# Patient Record
Sex: Female | Born: 1980 | Race: White | Hispanic: No | Marital: Married | Smoking: Current every day smoker
Health system: Southern US, Community
[De-identification: ages and names within clinical notes are randomized; demographics above are authoritative.]

## PROBLEM LIST (undated history)

## (undated) DIAGNOSIS — K219 Gastro-esophageal reflux disease without esophagitis: Secondary | ICD-10-CM

## (undated) DIAGNOSIS — J45909 Unspecified asthma, uncomplicated: Secondary | ICD-10-CM

## (undated) DIAGNOSIS — E785 Hyperlipidemia, unspecified: Secondary | ICD-10-CM

## (undated) DIAGNOSIS — R51 Headache: Secondary | ICD-10-CM

## (undated) DIAGNOSIS — F1410 Cocaine abuse, uncomplicated: Secondary | ICD-10-CM

## (undated) DIAGNOSIS — F32A Depression, unspecified: Secondary | ICD-10-CM

## (undated) DIAGNOSIS — S92909A Unspecified fracture of unspecified foot, initial encounter for closed fracture: Secondary | ICD-10-CM

## (undated) DIAGNOSIS — R519 Headache, unspecified: Secondary | ICD-10-CM

## (undated) DIAGNOSIS — F329 Major depressive disorder, single episode, unspecified: Secondary | ICD-10-CM

## (undated) DIAGNOSIS — I10 Essential (primary) hypertension: Secondary | ICD-10-CM

## (undated) HISTORY — PX: TUBAL LIGATION: SHX77

## (undated) HISTORY — PX: WISDOM TOOTH EXTRACTION: SHX21

## (undated) HISTORY — PX: LAPAROSCOPIC TUBAL LIGATION: SUR803

## (undated) HISTORY — DX: Hyperlipidemia, unspecified: E78.5

## (undated) HISTORY — PX: MULTIPLE TOOTH EXTRACTIONS: SHX2053

---
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
## 2006-02-17 ENCOUNTER — Ambulatory Visit: Payer: Self-pay | Admitting: *Deleted

## 2006-02-17 ENCOUNTER — Inpatient Hospital Stay (HOSPITAL_COMMUNITY): Admission: AD | Admit: 2006-02-17 | Discharge: 2006-02-20 | Payer: Self-pay | Admitting: *Deleted

## 2010-04-12 ENCOUNTER — Ambulatory Visit: Payer: Self-pay | Admitting: Psychiatry

## 2010-04-12 ENCOUNTER — Emergency Department (HOSPITAL_COMMUNITY): Admission: EM | Admit: 2010-04-12 | Discharge: 2010-04-12 | Payer: Self-pay | Admitting: Emergency Medicine

## 2010-04-13 ENCOUNTER — Inpatient Hospital Stay (HOSPITAL_COMMUNITY)
Admission: EM | Admit: 2010-04-13 | Discharge: 2010-04-16 | Payer: Self-pay | Source: Home / Self Care | Admitting: Psychiatry

## 2010-08-14 LAB — SALICYLATE LEVEL: Salicylate Lvl: <4 mg/dL (ref 2.8–20.0)

## 2010-08-14 LAB — CBC
Hemoglobin: 13.3 g/dL (ref 12.0–15.0)
MCH: 29.6 pg (ref 26.0–34.0)
MCHC: 33.7 g/dL (ref 30.0–36.0)
Platelets: 194 10*3/uL (ref 150–400)
RBC: 4.49 MIL/uL (ref 3.87–5.11)

## 2010-08-14 LAB — URINALYSIS, ROUTINE W REFLEX MICROSCOPIC
Ketones, ur: NEGATIVE mg/dL
Protein, ur: NEGATIVE mg/dL
Urobilinogen, UA: 0.2 mg/dL (ref 0.0–1.0)

## 2010-08-14 LAB — ACETAMINOPHEN LEVEL: Acetaminophen (Tylenol), Serum: <10 ug/mL — ABNORMAL LOW (ref 10–30)

## 2010-08-14 LAB — BASIC METABOLIC PANEL
Calcium: 8.7 mg/dL (ref 8.4–10.5)
Creatinine, Ser: 0.78 mg/dL (ref 0.4–1.2)
GFR calc Af Amer: >60 mL/min (ref >60)
GFR calc non Af Amer: >60 mL/min (ref >60)
Glucose, Bld: 66 mg/dL — ABNORMAL LOW (ref 70–99)
Sodium: 141 mEq/L (ref 135–145)

## 2010-08-14 LAB — RAPID URINE DRUG SCREEN, HOSP PERFORMED
Amphetamines: NOT DETECTED
Benzodiazepines: NOT DETECTED
Tetrahydrocannabinol: NOT DETECTED

## 2010-08-14 LAB — URINE CULTURE: Colony Count: 85000

## 2010-08-14 LAB — DIFFERENTIAL
Basophils Relative: 1 % (ref 0–1)
Eosinophils Absolute: 0.1 10*3/uL (ref 0.0–0.7)
Monocytes Relative: 5 % (ref 3–12)
Neutro Abs: 6.3 10*3/uL (ref 1.7–7.7)
Neutrophils Relative %: 61 % (ref 43–77)

## 2010-08-14 LAB — URINE MICROSCOPIC-ADD ON

## 2010-08-14 LAB — PREGNANCY, URINE: Preg Test, Ur: NEGATIVE

## 2010-10-19 NOTE — H&P (Signed)
NAME:  Pamela Sanford, Pamela Sanford NO.:  [id number]   MEDICAL RECORD NO.:  [id number]          PATIENT TYPE:  IPS   LOCATION:  0303                          FACILITY:  [facility]   PHYSICIAN:  Jasmine Pang, M.D. DATE OF BIRTH:  07/23/1980   DATE OF ADMISSION:  02/17/2006  DATE OF DISCHARGE:                         PSYCHIATRIC ADMISSION ASSESSMENT   IDENTIFYING INFORMATION:  This is a 30 year old single white female, this is  a voluntary admission.   HISTORY OF PRESENT ILLNESS:  This 77 year old single mother left her baby in  the care of an adult relative last Thursday as planned, then went on an  unexpected four day crack binge.  She called the caregiver on Friday to  check on the condition of the baby.  Then, when the caregiver attempted to  return the baby on Saturday, the patient could not be found.  The caregiver  filed a missing persons report and protective services was activated.  The  patient presented to the emergency room with suicidal thoughts, admitting to  having depression for the past two weeks, suicidal thoughts for two days,  with the plan to overdose on her medication.  She endorses two weeks of  cocaine cravings, increasingly depressed mood that is somewhat variable  stating I cannot believe that I did that referring to abandoning her child  for a crack binge.  She endorses being stressed by the recent death of her  mother six months ago who was found dead in her home, possibly from cardiac  problems, and the patient endorses prostituting herself for drugs and cash,  most recently within the past week.  She reports that her relationship with  her boyfriend is stable.  Her mother was her best friend and primary  caregiver and then was gone.  The patient also endorses having significant  cocaine cravings which come on and off with variable mood and some thought  agitation.   PAST PSYCHIATRIC HISTORY:  This is the first admission to [facility].  The patient does have a history of a prior  admission to [facility] at age 67 but is unable to describe  any very clear circumstances regarding the admission.  She also does not  know what medication she was given, although was treated with medications at  that time.  She is a poor historian.  She does endorse the use of substances  including cocaine and recently was prescribed Xanax which she is taking as  prescribed.   ALCOHOL AND DRUG HISTORY:  As noted above, the patient endorses a history of  cocaine use, denies drug or other substance use.   MEDICAL HISTORY:  The patient is followed by Dr. Sherryll Burger who [place] and Dr. Catha Gosselin who is her OB/GYN  physician.  Medication problems are none.   MEDICATIONS:  Omeprazole 20 mg daily.   She is using condoms for contraception.   ALLERGIES:  No drug allergies.   POSITIVE PHYSICAL FINDINGS:  Full physical exam was done in the emergency  room, it is noted in the record.  On admission to our unit, this is a small  built female, 5 feet 2 inches tall, 142 pounds, temperature 98.4, pulse 79,  respirations 22, blood pressure 111/68.  Neurologic exam was normal.  She  does have a disconjugate gaze.   DIAGNOSTIC STUDIES:  CBC with WBC 12.7, hemoglobin 14, hematocrit 40.7,  platelets 210,000.  Chemistries reveal sodium 139, potassium 3.5, chloride  106, CO2 26, BUN 6, creatinine 1.1, and random glucose 94, calcium 9.1,  alcohol level was less than 5.  Urine pregnancy test was negative.  Urine  drug screen was positive for cocaine, negative for benzodiazepine.  She does  have a prescription for Xanax 0.25 mg b.i.d. p.r.n. but has not had any in  about four days.   MENTAL STATUS EXAM:  Full alert female with blunted affect, variable eye  contact, generally cooperative and directable, socially withdrawn, affect  blunting is present.  Speech decreased in production, soft in  tone, barely  audible at times, normal pace.  Mood depressed, ashamed, guilty, verbalizes  her shame over using cocaine and neglecting the care of her infant.  She is  very interested in doing whatever she needs to do to assume her parental  duties.  She wants to comply with social services request.  Thought process  logical and coherent.  Positive for suicidal thoughts with thoughts of  possibly overdosing on medicine, no evidence of psychosis, no homicidal  thoughts, she is able to contract for safety.  She is oriented x3.  Cognition is intact.  Insight is poor.  Impulse control and judgment within  normal limits.   DIAGNOSIS:  AXIS I            Major depression versus bipolar disorder,  cocaine abuse and dependence.  AXIS II           Deferred.  AXIS III          Dental caries, teeth being in very poor condition.  AXIS IV           Severe issues with parenting and grief issues related to  the death of a parent and loss of primary family support.  AXIS V            Current 34, past year 24.   PLAN:  Voluntarily admit the patient with q.15 minute checks in place, to  alleviate her suicidal thought, control her cocaine cravings, and improve  her coping skills.  We are going to ask the case manager to, with the  patient's permission, to coordinate with [facility], and the patient is interested  in this.  We are starting her on Symmetrel 100 mg p.o. b.i.d. for cocaine  cravings which she is interested in.  We have discussed with her the  possibility of getting into a dental clinic and we are going to try to  coordinate that for her through the health department and we are going to  start her on Depakote ER 250 mg p.o. q.h.s. and Zoloft 25 mg daily.  The  patient is able to verbalize her agreement with the plan.  Estimated length  of stay is six days.      Margaret A. Lorin Picket, N.P.      Jasmine Pang, M.D.  Electronically Signed   MAS/MEDQ  D:  02/18/2006  T:  02/19/2006  Job:   [id number]

## 2010-10-19 NOTE — Discharge Summary (Signed)
NAME:  Pamela Sanford, FIALLOS NO.:  [id number]   MEDICAL RECORD NO.:  [id number]          PATIENT TYPE:  IPS   LOCATION:  0303                          FACILITY:  [facility]   PHYSICIAN:  Jasmine Pang, M.D. DATE OF BIRTH:  11/05/1980   DATE OF ADMISSION:  02/18/2006  DATE OF DISCHARGE:  02/20/2006                                 DISCHARGE SUMMARY   IDENTIFYING INFORMATION:  Patient is a 30 year old single Caucasian female  who was admitted on a voluntary basis.   HISTORY OF PRESENT ILLNESS:  This patient has severe cocaine problems.  She  had left her baby in the care of an adult relative then went on a 4-day  crack binge.  She could not be found.  Her boyfriend, who does not use  drugs, looked for her everywhere and finally did locate her.  Missing  Persons report was placed with the police.  Protective services are involved  and are requiring the patient to get treatment.  She cannot get her baby  back immediately.  The patient reports suicidal ideation for 2 weeks with a  plan to overdose, some agitation, periodic cocaine cravings, she endorses  depressed mood and variable mood.  She is stressed by the death of her  mother 6 months ago who was found dead of (?) cardiac problems in the home.  The patient has been prostituting herself for drugs and cash.  This is the  first [facility] admission for the patient.  She had a history of admission to  [facility] at the age of 59.  She is on no psychiatric  medications.  For further admission information see psychiatric admission  assessment.   PHYSICAL EXAMINATION:  Physical exam was done in the [facility] ED.  She was  healthy with no acute medical problems.   ADMISSION LABORATORIES:  Admission laboratories were done in the ED and  reviewed by the physician there.   HOSPITAL COURSE:  Upon admission the patient was continued on omeprazole 20  mg p.o. daily.  She was also placed on trazodone 50 mg p.o. q.h.s.  p.r.n.  insomnia and Librium 25 mg p.o. q.6 h p.r.n. withdrawal or anxiety.  She was  placed on Seroquel 25 mg p.o. q.4 h p.r.n. agitation.  On February 18, 2006  the patient was started on Zoloft 25 mg daily and Depakote ER 250 mg q.h.s.,  the Librium was discontinued, she was also started on Symmetrel 100 mg p.o.  b.i.d.  On the day of discharge the patient's Depakote ER was increased to  500 mg p.o. q.h.s. and Zoloft was increased to 50 mg p.o. daily.  The  patient tolerated these medications well with no significant side effects.   On February 18, 2006 the patient stated she is here for detox from cocaine.  She has a history of mood swings, some depression versus some involvement in  activities that are harmful (prostitution and cocaine use, leaving home for  4 days without letting anyone know, she has a 101-month-old son at home, her  mother died suddenly of an MI 6 months ago).  She feels very regretful for  having left her son like she did.  On February 19, 2006 the patient was  quiet and reserved but cooperative.  She discussed her crack addiction.  She  states the amantadine prescribed in the hospital helped.  She feels less  depressed then when she came in.  Upon admission she stated, I couldn't do  nothing right.  She states her 57-month-old son is with his foster  grandmother.  They have already set up a long-term treatment program at  [facility] for her upon discharge.   On February 20, 2006 the patient's mental status had improved markedly.  She was friendly, warm and cooperative.  She had good eye contact.  Psychomotor activity was within normal limits.  Her speech was normal rate  and flow.  Mood was less depressed and anxious.  Affect wide range.  No  suicidal or homicidal ideation.  No self-injurious behavior.  No auditory or  visual hallucinations.  No paranoia or delusions.  Thoughts logical and goal-  directed.  Thought content no  predominant theme.  Cognitive was back to  baseline.  She was felt ready to go home.   DISCHARGE DIAGNOSES:   AXIS I:  1. Mood disorder, not otherwise specified (rule out bipolar disorder).  2. Cocaine dependence.   AXIS II:  None.   AXIS III:  Dental caries.   AXIS IV:  Severe (problems with primary support group, grief over the death  of her parent, other psychosocial problems including prostituting herself  for drugs and money, economic problems).   AXIS V:  Global Assessment of Functioning upon discharge was 50.  Global  Assessment of Functioning upon admission was 34.  Global Assessment of  Functioning highest past year 67.   DISCHARGE PLANS:  There were no activity level or dietary restrictions.  She  was to follow up at the dental clinic in [facility]  Department.  To make an appointment she is to call and was given a number to  arrange this appointment.  She was to follow up at the health department or  her OB doctor for well-woman care and birth control issues.   DISCHARGE MEDICATIONS:  1. Omeprazole 20 mg daily.  2. Amantadine 100 mg b.i.d.  3. Zoloft 50 mg daily.  4. Depakote ER 500 mg at h.s.  5. Trazodone 50 mg at h.s. p.r.n. insomnia.   PENDING LABORATORIES:  An a.m. Depakote level, hepatic profile and CBC are  pending at the time of discharge but are not returned yet.  STD workup was  also pending at the time of discharge.  This will be followed up on.   POST-HOSPITAL CARE PLANS:  The patient will be seen at the [facility] on February 27, 2006 at 10:30 a.m.      Jasmine Pang, M.D.  Electronically Signed     BHS/MEDQ  D:  02/20/2006  T:  02/21/2006  Job:  [id number]

## 2013-02-24 ENCOUNTER — Emergency Department (HOSPITAL_COMMUNITY)
Admission: EM | Admit: 2013-02-24 | Discharge: 2013-02-24 | Disposition: A | Discharge status: Home / Self Care | Payer: Self-pay | Attending: Emergency Medicine | Admitting: Emergency Medicine

## 2013-02-24 ENCOUNTER — Encounter (HOSPITAL_COMMUNITY): Payer: Self-pay | Admitting: Emergency Medicine

## 2013-02-24 DIAGNOSIS — F17200 Nicotine dependence, unspecified, uncomplicated: Secondary | ICD-10-CM | POA: Insufficient documentation

## 2013-02-24 DIAGNOSIS — M25473 Effusion, unspecified ankle: Secondary | ICD-10-CM | POA: Insufficient documentation

## 2013-02-24 DIAGNOSIS — Z8659 Personal history of other mental and behavioral disorders: Secondary | ICD-10-CM | POA: Insufficient documentation

## 2013-02-24 DIAGNOSIS — R52 Pain, unspecified: Secondary | ICD-10-CM | POA: Insufficient documentation

## 2013-02-24 DIAGNOSIS — S92902S Unspecified fracture of left foot, sequela: Secondary | ICD-10-CM

## 2013-02-24 DIAGNOSIS — M25579 Pain in unspecified ankle and joints of unspecified foot: Secondary | ICD-10-CM | POA: Insufficient documentation

## 2013-02-24 DIAGNOSIS — M25476 Effusion, unspecified foot: Secondary | ICD-10-CM | POA: Insufficient documentation

## 2013-02-24 HISTORY — DX: Depression, unspecified: F32.A

## 2013-02-24 HISTORY — DX: Major depressive disorder, single episode, unspecified: F32.9

## 2013-02-24 HISTORY — DX: Unspecified fracture of unspecified foot, initial encounter for closed fracture: S92.909A

## 2013-02-24 MED ORDER — OXYCODONE-ACETAMINOPHEN 5-325 MG PO TABS: 1.0000 | ORAL_TABLET | ORAL | Status: DC | PRN

## 2013-02-24 NOTE — ED Notes (Signed)
Patient here for recheck of fracture to foot. Per patient happened last week and was seen here but unable to follow-up with orthopedic dr yet. Patient has splint to right foot and leg. Patient reports increased pain, especially at night.

## 2013-02-27 NOTE — ED Provider Notes (Signed)
CSN: [id number]     Arrival date & time 02/24/13  1400 History   First MD Initiated Contact with Patient 02/24/13 1434     Chief Complaint  Patient presents with  . Foot Injury   (Consider location/radiation/quality/duration/timing/severity/associated sxs/prior Treatment) HPI Comments: Pamela Sanford is a 32 y.o. Female presenting for assistance with pain control as she is waiting to get in with an orthopedic doctor in [place] [facility]) for management of a foot fracture she sustained 5 days ago.  She was seen at [facility] at which time she was placed in a posterior splint and has been using crutches to avoid weight bearing.  She has run out of her oxycodone and now has increased pain.  She is scheduled to see the orthopedist in 4 days.  She denies any new pain, just increased pain since she ran out of her pain pills.  She is using ice and elevation as much as possible.  She denies numbness distal in her foot or toes and her swelling is improving.    The history is provided by the patient.    Past Medical History  Diagnosis Date  . Foot fracture   . Depression    Past Surgical History  Procedure Laterality Date  . Wisdom tooth extraction    . Multiple tooth extractions     History reviewed. No pertinent family history. History  Substance Use Topics  . Smoking status: Current Every Day Smoker -- 0.04 packs/day for 8 years    Types: Cigarettes  . Smokeless tobacco: Never Used  . Alcohol Use: No   OB History   Grav Para Term Preterm Abortions TAB SAB Ect Mult Living   2 2 2       2      Review of Systems  Constitutional: Negative for fever.  Musculoskeletal: Positive for joint swelling and arthralgias. Negative for myalgias.  Neurological: Negative for weakness and numbness.    Allergies  Review of patient's allergies indicates no known allergies.  Home Medications   Current Outpatient Rx  Name  Route  Sig  Dispense  Refill  . ibuprofen (ADVIL,MOTRIN) 800 MG  tablet   Oral   Take 800 mg by mouth every 8 (eight) hours as needed for pain.         Marland Kitchen oxyCODONE-acetaminophen (PERCOCET/ROXICET) 5-325 MG per tablet   Oral   Take 1 tablet by mouth every 4 (four) hours as needed for pain.   20 tablet   0    BP 109/60  Pulse 94  Temp(Src) 97.9 F (36.6 C) (Oral)  Resp 17  Ht 5\' 4"  (1.626 m)  Wt 150 lb (68.04 kg)  BMI 25.73 kg/m2  SpO2 100%  LMP 02/02/2013 Physical Exam  Constitutional: She appears well-developed and well-nourished.  HENT:  Head: Atraumatic.  Neck: Normal range of motion.  Cardiovascular:  Pulses equal bilaterally  Musculoskeletal: She exhibits tenderness.  ttp dorsal midfoot.  Dorsal pedis pulse intact. Less than 3 sec cap refill in toes.  Neurological: She is alert. She has normal strength. She displays normal reflexes. No sensory deficit.  Equal strength  Skin: Skin is warm and dry.  Psychiatric: She has a normal mood and affect.    ED Course  Procedures (including critical care time) Labs Review Labs Reviewed - No data to display Imaging Review No results found.  MDM   1. Foot fracture, left, sequela    Xray from [facility] hospital reviewed,  Avulsion fx at proximal navicular bone.  [place]  controlled substance database reviewed. Pt does not have hx of frequent narcotic prescriptions.  She was prescribed oxycodone,  Encouraged f/u with ortho as planned.    Burgess Amor, PA-C 02/27/13 1158

## 2013-03-01 NOTE — ED Provider Notes (Signed)
Medical screening examination/treatment/procedure(s) were performed by non-physician practitioner and as supervising physician I was immediately available for consultation/collaboration.  Allicia Culley, MD 03/01/13 1648 

## 2013-04-01 ENCOUNTER — Encounter (HOSPITAL_COMMUNITY): Payer: Self-pay | Admitting: Emergency Medicine

## 2013-04-01 ENCOUNTER — Emergency Department (HOSPITAL_COMMUNITY): Payer: Medicaid Other

## 2013-04-01 ENCOUNTER — Emergency Department (HOSPITAL_COMMUNITY)
Admission: EM | Admit: 2013-04-01 | Discharge: 2013-04-01 | Disposition: A | Discharge status: Home / Self Care | Payer: Medicaid Other | Attending: Emergency Medicine | Admitting: Emergency Medicine

## 2013-04-01 DIAGNOSIS — Z8659 Personal history of other mental and behavioral disorders: Secondary | ICD-10-CM | POA: Insufficient documentation

## 2013-04-01 DIAGNOSIS — S9031XA Contusion of right foot, initial encounter: Secondary | ICD-10-CM

## 2013-04-01 DIAGNOSIS — S9030XA Contusion of unspecified foot, initial encounter: Secondary | ICD-10-CM | POA: Insufficient documentation

## 2013-04-01 DIAGNOSIS — Z8719 Personal history of other diseases of the digestive system: Secondary | ICD-10-CM | POA: Insufficient documentation

## 2013-04-01 DIAGNOSIS — IMO0002 Reserved for concepts with insufficient information to code with codable children: Secondary | ICD-10-CM | POA: Insufficient documentation

## 2013-04-01 DIAGNOSIS — Y9389 Activity, other specified: Secondary | ICD-10-CM | POA: Insufficient documentation

## 2013-04-01 DIAGNOSIS — F17200 Nicotine dependence, unspecified, uncomplicated: Secondary | ICD-10-CM | POA: Insufficient documentation

## 2013-04-01 DIAGNOSIS — Y929 Unspecified place or not applicable: Secondary | ICD-10-CM | POA: Insufficient documentation

## 2013-04-01 DIAGNOSIS — IMO0001 Reserved for inherently not codable concepts without codable children: Secondary | ICD-10-CM | POA: Insufficient documentation

## 2013-04-01 HISTORY — DX: Gastro-esophageal reflux disease without esophagitis: K21.9

## 2013-04-01 MED ORDER — NAPROXEN 500 MG PO TABS: 500.0000 mg | ORAL_TABLET | Freq: Two times a day (BID) | ORAL | Status: DC

## 2013-04-01 MED ORDER — HYDROCODONE-ACETAMINOPHEN 5-325 MG PO TABS
1.0000 | ORAL_TABLET | Freq: Once | ORAL | Status: AC
  Administered 2013-04-01: 1 via ORAL
  Filled 2013-04-01: qty 1
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
## 2013-04-01 NOTE — ED Notes (Signed)
Patient with no complaints at this time. Respirations even and unlabored. Skin warm/dry. Discharge instructions reviewed with patient at this time. Patient given opportunity to voice concerns/ask questions. IV removed per policy and band-aid applied to site. Patient discharged at this time and left Emergency Department with steady gait. Patient has ride home

## 2013-04-01 NOTE — ED Provider Notes (Signed)
Medical screening examination/treatment/procedure(s) were performed by non-physician practitioner and as supervising physician I was immediately available for consultation/collaboration.    Vida Roller, MD 04/01/13 1600
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
## 2013-04-24 ENCOUNTER — Encounter (HOSPITAL_COMMUNITY): Payer: Self-pay | Admitting: Emergency Medicine

## 2013-04-24 ENCOUNTER — Emergency Department (HOSPITAL_COMMUNITY): Payer: Medicaid Other

## 2013-04-24 ENCOUNTER — Emergency Department (HOSPITAL_COMMUNITY)
Admission: EM | Admit: 2013-04-24 | Discharge: 2013-04-24 | Disposition: A | Discharge status: Home / Self Care | Payer: Medicaid Other | Attending: Emergency Medicine | Admitting: Emergency Medicine

## 2013-04-24 DIAGNOSIS — R300 Dysuria: Secondary | ICD-10-CM | POA: Insufficient documentation

## 2013-04-24 DIAGNOSIS — Z8659 Personal history of other mental and behavioral disorders: Secondary | ICD-10-CM | POA: Insufficient documentation

## 2013-04-24 DIAGNOSIS — R350 Frequency of micturition: Secondary | ICD-10-CM | POA: Insufficient documentation

## 2013-04-24 DIAGNOSIS — R109 Unspecified abdominal pain: Secondary | ICD-10-CM

## 2013-04-24 DIAGNOSIS — F17200 Nicotine dependence, unspecified, uncomplicated: Secondary | ICD-10-CM | POA: Insufficient documentation

## 2013-04-24 DIAGNOSIS — Z8719 Personal history of other diseases of the digestive system: Secondary | ICD-10-CM | POA: Insufficient documentation

## 2013-04-24 DIAGNOSIS — R197 Diarrhea, unspecified: Secondary | ICD-10-CM | POA: Insufficient documentation

## 2013-04-24 DIAGNOSIS — J45909 Unspecified asthma, uncomplicated: Secondary | ICD-10-CM | POA: Insufficient documentation

## 2013-04-24 DIAGNOSIS — Z3202 Encounter for pregnancy test, result negative: Secondary | ICD-10-CM | POA: Insufficient documentation

## 2013-04-24 DIAGNOSIS — Z8781 Personal history of (healed) traumatic fracture: Secondary | ICD-10-CM | POA: Insufficient documentation

## 2013-04-24 HISTORY — DX: Unspecified asthma, uncomplicated: J45.909

## 2013-04-24 LAB — CBC WITH DIFFERENTIAL/PLATELET
Basophils Absolute: 0.1 10*3/uL (ref 0.0–0.1)
Eosinophils Absolute: 0.2 10*3/uL (ref 0.0–0.7)
Eosinophils Relative: 2 % (ref 0–5)
HCT: 38.8 % (ref 36.0–46.0)
Lymphocytes Relative: 38 % (ref 12–46)
Lymphs Abs: 4 10*3/uL (ref 0.7–4.0)
MCH: 29.2 pg (ref 26.0–34.0)
MCV: 86.6 fL (ref 78.0–100.0)
Monocytes Absolute: 0.5 10*3/uL (ref 0.1–1.0)
RDW: 13.2 % (ref 11.5–15.5)
WBC: 10.4 10*3/uL (ref 4.0–10.5)

## 2013-04-24 LAB — COMPREHENSIVE METABOLIC PANEL
ALT: 9 U/L (ref 0–35)
AST: 11 U/L (ref 0–37)
Albumin: 4 g/dL (ref 3.5–5.2)
CO2: 25 mEq/L (ref 19–32)
Chloride: 102 mEq/L (ref 96–112)
Creatinine, Ser: 0.85 mg/dL (ref 0.50–1.10)
GFR calc non Af Amer: >90 mL/min (ref >90)
Potassium: 3.8 mEq/L (ref 3.5–5.1)
Sodium: 137 mEq/L (ref 135–145)
Total Bilirubin: 0.2 mg/dL — ABNORMAL LOW (ref 0.3–1.2)

## 2013-04-24 LAB — URINALYSIS, ROUTINE W REFLEX MICROSCOPIC
Glucose, UA: NEGATIVE mg/dL
Hgb urine dipstick: NEGATIVE
Leukocytes, UA: NEGATIVE
Specific Gravity, Urine: >1.03 — ABNORMAL HIGH (ref 1.005–1.030)
pH: 6 (ref 5.0–8.0)

## 2013-04-24 LAB — PREGNANCY, URINE: Preg Test, Ur: NEGATIVE

## 2013-04-24 MED ORDER — HYDROCODONE-ACETAMINOPHEN 5-325 MG PO TABS: 1.0000 | ORAL_TABLET | Freq: Four times a day (QID) | ORAL | Status: DC | PRN

## 2013-04-24 MED ORDER — HYDROMORPHONE HCL PF 1 MG/ML IJ SOLN
1.0000 mg | Freq: Once | INTRAMUSCULAR | Status: AC
  Administered 2013-04-24: 1 mg via INTRAVENOUS
  Filled 2013-04-24: qty 1

## 2013-04-24 MED ORDER — SODIUM CHLORIDE 0.9 % IV SOLN: INTRAVENOUS | Status: DC

## 2013-04-24 MED ORDER — ONDANSETRON HCL 4 MG/2ML IJ SOLN
4.0000 mg | Freq: Once | INTRAMUSCULAR | Status: AC
  Administered 2013-04-24: 4 mg via INTRAVENOUS
  Filled 2013-04-24: qty 2

## 2013-04-24 MED ORDER — IOHEXOL 300 MG/ML  SOLN
50.0000 mL | Freq: Once | INTRAMUSCULAR | Status: AC | PRN
  Administered 2013-04-24: 50 mL via ORAL

## 2013-04-24 MED ORDER — SODIUM CHLORIDE 0.9 % IV BOLUS (SEPSIS)
250.0000 mL | Freq: Once | INTRAVENOUS | Status: AC
  Administered 2013-04-24: 250 mL via INTRAVENOUS

## 2013-04-24 MED ORDER — IOHEXOL 300 MG/ML  SOLN
100.0000 mL | Freq: Once | INTRAMUSCULAR | Status: AC | PRN
  Administered 2013-04-24: 100 mL via INTRAVENOUS

## 2013-04-24 NOTE — ED Provider Notes (Signed)
CSN: [id number]     Arrival date & time 04/24/13  1723 History   First MD Initiated Contact with Patient 04/24/13 1739     Chief Complaint  Patient presents with  . Abdominal Pain  . Dysuria   (Consider location/radiation/quality/duration/timing/severity/associated sxs/prior Treatment) Patient is a 32 y.o. female presenting with abdominal pain and dysuria. The history is provided by the patient.  Abdominal Pain Associated symptoms: diarrhea and dysuria   Associated symptoms: no chest pain, no fever, no nausea, no shortness of breath and no vomiting   Dysuria Associated symptoms: abdominal pain   Associated symptoms: no fever, no nausea and no vomiting    patient with one-week history of lower bowel pain occasional abdominal pain. Associated with urinary frequency and burning some loose bowel movements no true diarrhea. No nausea no vomiting no fever. Patient's had similar symptoms in the past as been related to urinary tract infection. Pain is described as an 8 constant but waxes and wanes. Denies any blood in the urine. No blood in the bowel movements. Pain is nonradiating. Not made better or worse by anything. Patient gives pain an 8/10.  Past Medical History  Diagnosis Date  . Foot fracture   . Depression   . GERD (gastroesophageal reflux disease)   . Asthma    Past Surgical History  Procedure Laterality Date  . Wisdom tooth extraction    . Multiple tooth extractions     History reviewed. No pertinent family history. History  Substance Use Topics  . Smoking status: Current Every Day Smoker -- 0.04 packs/day for 8 years    Types: Cigarettes  . Smokeless tobacco: Never Used  . Alcohol Use: No   OB History   Grav Para Term Preterm Abortions TAB SAB Ect Mult Living   2 2 2       2      Review of Systems  Constitutional: Negative for fever.  HENT: Negative for congestion.   Respiratory: Negative for shortness of breath.   Cardiovascular: Negative for chest pain.   Gastrointestinal: Positive for abdominal pain and diarrhea. Negative for nausea and vomiting.  Genitourinary: Positive for dysuria and frequency.  Musculoskeletal: Negative for back pain.  Skin: Negative for rash.  Neurological: Negative for headaches.  Hematological: Does not bruise/bleed easily.  Psychiatric/Behavioral: Negative for confusion.    Allergies  Ibuprofen and Tramadol  Home Medications   Current Outpatient Rx  Name  Route  Sig  Dispense  Refill  . HYDROcodone-acetaminophen (NORCO/VICODIN) 5-325 MG per tablet   Oral   Take 1-2 tablets by mouth every 6 (six) hours as needed for moderate pain.   14 tablet   0    BP 117/70  Pulse 86  Temp(Src) 97.5 F (36.4 C) (Oral)  Resp 18  Ht 5\' 3"  (1.6 m)  Wt 150 lb (68.04 kg)  BMI 26.58 kg/m2  SpO2 100%  LMP 04/14/2013 Physical Exam  Nursing note and vitals reviewed. Constitutional: She is oriented to person, place, and time. She appears well-developed and well-nourished. No distress.  HENT:  Head: Normocephalic and atraumatic.  Mouth/Throat: Oropharynx is clear and moist.  Eyes: Conjunctivae and EOM are normal. Pupils are equal, round, and reactive to light.  Neck: Normal range of motion.  Cardiovascular: Normal rate, regular rhythm and normal heart sounds.   No murmur heard. Pulmonary/Chest: Effort normal and breath sounds normal. No respiratory distress.  Abdominal: Soft. Bowel sounds are normal. She exhibits no mass. There is no tenderness.  Musculoskeletal: Normal range of  motion.  Neurological: She is alert and oriented to person, place, and time. No cranial nerve deficit. She exhibits normal muscle tone. Coordination normal.  Skin: Skin is warm. No rash noted.    ED Course  Procedures (including critical care time) Labs Review Labs Reviewed  URINALYSIS, ROUTINE W REFLEX MICROSCOPIC - Abnormal; Notable for the following:    APPearance HAZY (*)    Specific Gravity, Urine >1.030 (*)    All other  components within normal limits  COMPREHENSIVE METABOLIC PANEL - Abnormal; Notable for the following:    Total Bilirubin 0.2 (*)    All other components within normal limits  LIPASE, BLOOD - Abnormal; Notable for the following:    Lipase 81 (*)    All other components within normal limits  PREGNANCY, URINE  CBC WITH DIFFERENTIAL   Results for orders placed during the hospital encounter of 04/24/13  URINALYSIS, ROUTINE W REFLEX MICROSCOPIC      Result Value Range   Color, Urine YELLOW  YELLOW   APPearance HAZY (*) CLEAR   Specific Gravity, Urine >1.030 (*) 1.005 - 1.030   pH 6.0  5.0 - 8.0   Glucose, UA NEGATIVE  NEGATIVE mg/dL   Hgb urine dipstick NEGATIVE  NEGATIVE   Bilirubin Urine NEGATIVE  NEGATIVE   Ketones, ur NEGATIVE  NEGATIVE mg/dL   Protein, ur NEGATIVE  NEGATIVE mg/dL   Urobilinogen, UA 0.2  0.0 - 1.0 mg/dL   Nitrite NEGATIVE  NEGATIVE   Leukocytes, UA NEGATIVE  NEGATIVE  PREGNANCY, URINE      Result Value Range   Preg Test, Ur NEGATIVE  NEGATIVE  COMPREHENSIVE METABOLIC PANEL      Result Value Range   Sodium 137  135 - 145 mEq/L   Potassium 3.8  3.5 - 5.1 mEq/L   Chloride 102  96 - 112 mEq/L   CO2 25  19 - 32 mEq/L   Glucose, Bld 80  70 - 99 mg/dL   BUN 16  6 - 23 mg/dL   Creatinine, Ser 1.61  0.50 - 1.10 mg/dL   Calcium 9.3  8.4 - 09.6 mg/dL   Total Protein 7.1  6.0 - 8.3 g/dL   Albumin 4.0  3.5 - 5.2 g/dL   AST 11  0 - 37 U/L   ALT 9  0 - 35 U/L   Alkaline Phosphatase 70  39 - 117 U/L   Total Bilirubin 0.2 (*) 0.3 - 1.2 mg/dL   GFR calc non Af Amer >90  >90 mL/min   GFR calc Af Amer >90  >90 mL/min  LIPASE, BLOOD      Result Value Range   Lipase 81 (*) 11 - 59 U/L  CBC WITH DIFFERENTIAL      Result Value Range   WBC 10.4  4.0 - 10.5 K/uL   RBC 4.48  3.87 - 5.11 MIL/uL   Hemoglobin 13.1  12.0 - 15.0 g/dL   HCT 04.5  40.9 - 81.1 %   MCV 86.6  78.0 - 100.0 fL   MCH 29.2  26.0 - 34.0 pg   MCHC 33.8  30.0 - 36.0 g/dL   RDW 91.4  78.2 - 95.6 %    Platelets 216  150 - 400 K/uL   Neutrophils Relative % 54  43 - 77 %   Neutro Abs 5.6  1.7 - 7.7 K/uL   Lymphocytes Relative 38  12 - 46 %   Lymphs Abs 4.0  0.7 - 4.0 K/uL   Monocytes  Relative 5  3 - 12 %   Monocytes Absolute 0.5  0.1 - 1.0 K/uL   Eosinophils Relative 2  0 - 5 %   Eosinophils Absolute 0.2  0.0 - 0.7 K/uL   Basophils Relative 1  0 - 1 %   Basophils Absolute 0.1  0.0 - 0.1 K/uL    Imaging Review Ct Abdomen Pelvis W Contrast  04/24/2013   CLINICAL DATA:  Mid abdomen pain and dysuria  EXAM: CT ABDOMEN AND PELVIS WITH CONTRAST  TECHNIQUE: Multidetector CT imaging of the abdomen and pelvis was performed using the standard protocol following bolus administration of intravenous contrast.  CONTRAST:  50mL OMNIPAQUE IOHEXOL 300 MG/ML SOLN, OMNIPAQUE IOHEXOL 300 MG/ML SOLN  COMPARISON:  December 02, 2008  FINDINGS: The liver, spleen, pancreas, gallbladder, adrenal glands and kidneys are normal. There is no hydronephrosis bilaterally. Bilateral parapelvic renal cysts are identified. The aorta is normal. There is no abdominal lymphadenopathy. There is no small bowel obstruction or diverticulitis. The appendix is normal.  Images of the pelvis demonstrate fluid-filled bladder without abnormality. The uterus is normal. There is minimal dependent atelectasis of the posterior right lung base. There is no focal pneumonia or pleural effusion in the visualized lung bases. No acute abnormalities identified within the visualized bones.  IMPRESSION: No acute abnormality identified in the abdomen and pelvis.   Electronically Signed   By: Sherian Rein M.D.   On: 04/24/2013 19:40    EKG Interpretation   None       MDM   1. Abdominal pain    Workup for the abdominal pain without significant findings. Clinically it sound as if it was a urinary tract infection or perhaps early pyelonephritis. However CT scan is negative urinalysis is negative labs without significant findings there is a mild  elevation in the lipase symptoms do not clinically seemed to be consistent with pancreatitis. Have recommended patient maybe try bland diet or liquid diet for the next 24 hours. Urinalysis dated was negative. Also pregnancy test negative. No evidence of gallbladder disease no evidence of appendicitis.    Shelda Jakes, MD 04/24/13 2022

## 2013-04-24 NOTE — ED Notes (Addendum)
Pt with upper and lower abd pain since Sunday afternoon, at times with frequency, +burning on urination, + diarrhea, denies N/V

## 2013-06-02 ENCOUNTER — Encounter (HOSPITAL_COMMUNITY): Payer: Self-pay | Admitting: Emergency Medicine

## 2013-06-02 ENCOUNTER — Emergency Department (HOSPITAL_COMMUNITY)
Admission: EM | Admit: 2013-06-02 | Discharge: 2013-06-02 | Disposition: A | Discharge status: Home / Self Care | Payer: Medicaid Other | Attending: Emergency Medicine | Admitting: Emergency Medicine

## 2013-06-02 ENCOUNTER — Emergency Department (HOSPITAL_COMMUNITY): Payer: Medicaid Other

## 2013-06-02 DIAGNOSIS — M545 Low back pain, unspecified: Secondary | ICD-10-CM | POA: Insufficient documentation

## 2013-06-02 DIAGNOSIS — Z79899 Other long term (current) drug therapy: Secondary | ICD-10-CM | POA: Insufficient documentation

## 2013-06-02 DIAGNOSIS — Z8659 Personal history of other mental and behavioral disorders: Secondary | ICD-10-CM | POA: Insufficient documentation

## 2013-06-02 DIAGNOSIS — K219 Gastro-esophageal reflux disease without esophagitis: Secondary | ICD-10-CM | POA: Insufficient documentation

## 2013-06-02 DIAGNOSIS — G8929 Other chronic pain: Secondary | ICD-10-CM | POA: Insufficient documentation

## 2013-06-02 DIAGNOSIS — Z8781 Personal history of (healed) traumatic fracture: Secondary | ICD-10-CM | POA: Insufficient documentation

## 2013-06-02 DIAGNOSIS — R1011 Right upper quadrant pain: Secondary | ICD-10-CM | POA: Insufficient documentation

## 2013-06-02 DIAGNOSIS — J45909 Unspecified asthma, uncomplicated: Secondary | ICD-10-CM | POA: Insufficient documentation

## 2013-06-02 DIAGNOSIS — Z3202 Encounter for pregnancy test, result negative: Secondary | ICD-10-CM | POA: Insufficient documentation

## 2013-06-02 DIAGNOSIS — F17200 Nicotine dependence, unspecified, uncomplicated: Secondary | ICD-10-CM | POA: Insufficient documentation

## 2013-06-02 LAB — URINALYSIS, ROUTINE W REFLEX MICROSCOPIC
Bilirubin Urine: NEGATIVE
Ketones, ur: NEGATIVE mg/dL
Nitrite: NEGATIVE
Specific Gravity, Urine: 1.01 (ref 1.005–1.030)
Urobilinogen, UA: 0.2 mg/dL (ref 0.0–1.0)
pH: 6.5 (ref 5.0–8.0)

## 2013-06-02 LAB — CBC WITH DIFFERENTIAL/PLATELET
Basophils Absolute: 0.1 10*3/uL (ref 0.0–0.1)
Basophils Relative: 1 % (ref 0–1)
HCT: 37.8 % (ref 36.0–46.0)
Hemoglobin: 12.5 g/dL (ref 12.0–15.0)
Lymphocytes Relative: 27 % (ref 12–46)
Monocytes Absolute: 0.3 10*3/uL (ref 0.1–1.0)
Neutro Abs: 5 10*3/uL (ref 1.7–7.7)
Platelets: 224 10*3/uL (ref 150–400)
RDW: 13.5 % (ref 11.5–15.5)
WBC: 7.4 10*3/uL (ref 4.0–10.5)

## 2013-06-02 LAB — COMPREHENSIVE METABOLIC PANEL
Albumin: 3.9 g/dL (ref 3.5–5.2)
BUN: 5 mg/dL — ABNORMAL LOW (ref 6–23)
Calcium: 9.2 mg/dL (ref 8.4–10.5)
Chloride: 105 mEq/L (ref 96–112)
Creatinine, Ser: 0.73 mg/dL (ref 0.50–1.10)
Glucose, Bld: 90 mg/dL (ref 70–99)
Potassium: 4.5 mEq/L (ref 3.7–5.3)
Total Bilirubin: 0.4 mg/dL (ref 0.3–1.2)
Total Protein: 7 g/dL (ref 6.0–8.3)

## 2013-06-02 LAB — LIPASE, BLOOD: Lipase: 59 U/L (ref 11–59)

## 2013-06-02 LAB — URINE MICROSCOPIC-ADD ON

## 2013-06-02 LAB — PREGNANCY, URINE: Preg Test, Ur: NEGATIVE

## 2013-06-02 MED ORDER — ONDANSETRON HCL 4 MG/2ML IJ SOLN
4.0000 mg | Freq: Once | INTRAMUSCULAR | Status: AC
  Administered 2013-06-02: 4 mg via INTRAVENOUS
  Filled 2013-06-02: qty 2

## 2013-06-02 MED ORDER — GI COCKTAIL ~~LOC~~
30.0000 mL | Freq: Once | ORAL | Status: AC
  Administered 2013-06-02: 30 mL via ORAL
  Filled 2013-06-02: qty 30

## 2013-06-02 MED ORDER — FAMOTIDINE 20 MG PO TABS
20.0000 mg | ORAL_TABLET | Freq: Once | ORAL | Status: AC
  Administered 2013-06-02: 20 mg via ORAL
  Filled 2013-06-02: qty 1

## 2013-06-02 MED ORDER — MORPHINE SULFATE 4 MG/ML IJ SOLN
4.0000 mg | Freq: Once | INTRAMUSCULAR | Status: AC
  Administered 2013-06-02: 4 mg via INTRAVENOUS
  Filled 2013-06-02: qty 1

## 2013-06-02 MED ORDER — FAMOTIDINE 20 MG PO TABS: 20.0000 mg | ORAL_TABLET | Freq: Two times a day (BID) | ORAL | Status: DC

## 2013-06-02 MED ORDER — FAMOTIDINE IN NACL 20-0.9 MG/50ML-% IV SOLN: 20.0000 mg | Freq: Once | INTRAVENOUS | Status: DC

## 2013-06-02 NOTE — ED Notes (Signed)
[place] tech in to perform [place]

## 2013-06-02 NOTE — ED Provider Notes (Signed)
CSN: [id number]     Arrival date & time 06/02/13  1045 History   First MD Initiated Contact with Patient 06/02/13 1127     Chief Complaint  Patient presents with  . Abdominal Pain  . Back Pain   (Consider location/radiation/quality/duration/timing/severity/associated sxs/prior Treatment) HPI Comments: Pamela Sanford is a 32 y.o. Female presenting with chronic upper abdominal pain which is waxing and waning for the past month since her last visit here for this.  She describes cramping pain with radiation into her back at times, but not always.  She denies fevers, nausea, vomiting, no diarrhea.  Her last bm was this morning and normal and did not improve or worsen symptoms.  She denies vaginal discharge and is not sexually active, no dysuria or hematuria.  She had a normal work up for this last month here, including a normal abdominal Ct scan.  She has found no alleviators, nothing seems to worsen either.    She takes tylenol only for pain relief,  She does not take nsaids and denies reflux symptoms currently but does have a history of reflux when she was pregnant (6 years ago).       Patient is a 32 y.o. female presenting with abdominal pain and back pain. The history is provided by the patient.  Abdominal Pain Associated symptoms: no chest pain, no constipation, no diarrhea, no dysuria, no fever, no nausea, no shortness of breath, no sore throat, no vaginal discharge and no vomiting   Back Pain Associated symptoms: abdominal pain   Associated symptoms: no chest pain, no dysuria, no fever, no headaches, no numbness and no weakness     Past Medical History  Diagnosis Date  . Foot fracture   . Depression   . GERD (gastroesophageal reflux disease)   . Asthma    Past Surgical History  Procedure Laterality Date  . Wisdom tooth extraction    . Multiple tooth extractions     No family history on file. History  Substance Use Topics  . Smoking status: Current Every Day Smoker -- 0.04  packs/day for 8 years    Types: Cigarettes  . Smokeless tobacco: Never Used  . Alcohol Use: No   OB History   Grav Para Term Preterm Abortions TAB SAB Ect Mult Living   2 2 2       2      Review of Systems  Constitutional: Negative for fever.  HENT: Negative for congestion and sore throat.   Eyes: Negative.   Respiratory: Negative for chest tightness and shortness of breath.   Cardiovascular: Negative for chest pain.  Gastrointestinal: Positive for abdominal pain. Negative for nausea, vomiting, diarrhea and constipation.  Genitourinary: Negative.  Negative for dysuria and vaginal discharge.  Musculoskeletal: Positive for back pain. Negative for arthralgias, joint swelling and neck pain.  Skin: Negative.  Negative for rash and wound.  Neurological: Negative for dizziness, weakness, light-headedness, numbness and headaches.  Psychiatric/Behavioral: Negative.     Allergies  Ibuprofen and Tramadol  Home Medications   Current Outpatient Rx  Name  Route  Sig  Dispense  Refill  . acetaminophen (TYLENOL) 500 MG tablet   Oral   Take 500-1,000 mg by mouth every 8 (eight) hours as needed for moderate pain.         . famotidine (PEPCID) 20 MG tablet   Oral   Take 1 tablet (20 mg total) by mouth 2 (two) times daily.   30 tablet   0    BP  101/53  Pulse 72  Temp(Src) 98.2 F (36.8 C) (Oral)  Resp 19  Ht 5\' 2"  (1.575 m)  Wt 150 lb (68.04 kg)  BMI 27.43 kg/m2  SpO2 99%  LMP 05/03/2013 Physical Exam  Nursing note and vitals reviewed. Constitutional: She appears well-developed and well-nourished.  HENT:  Head: Normocephalic and atraumatic.  Eyes: Conjunctivae are normal.  Neck: Normal range of motion.  Cardiovascular: Normal rate, regular rhythm, normal heart sounds and intact distal pulses.   Pulmonary/Chest: Effort normal and breath sounds normal. She has no wheezes.  Abdominal: Soft. Bowel sounds are normal. She exhibits no distension. There is no hepatosplenomegaly.  There is tenderness in the right upper quadrant and epigastric area. There is no guarding, no CVA tenderness and negative Murphy's sign.  Pain is most pronounced in epigastric region.  Musculoskeletal: Normal range of motion.  Neurological: She is alert.  Skin: Skin is warm and dry.  Psychiatric: She has a normal mood and affect.    ED Course  Procedures (including critical care time) Labs Review Labs Reviewed  URINALYSIS, ROUTINE W REFLEX MICROSCOPIC - Abnormal; Notable for the following:    Hgb urine dipstick TRACE (*)    All other components within normal limits  URINE MICROSCOPIC-ADD ON - Abnormal; Notable for the following:    Squamous Epithelial / LPF FEW (*)    All other components within normal limits  COMPREHENSIVE METABOLIC PANEL - Abnormal; Notable for the following:    BUN 5 (*)    All other components within normal limits  PREGNANCY, URINE  LIPASE, BLOOD  CBC WITH DIFFERENTIAL   Imaging Review US Abdomen Limited  06/02/2013   CLINICAL DATA:  Abdominal pain, back pain.  EXAM: US ABDOMEN LIMITED - RIGHT UPPER QUADRANT  COMPARISON:  04/24/2013  FINDINGS: Gallbladder  No gallstones or wall thickening visualized. No sonographic Murphy sign noted.  Common bile duct  Diameter: Normal caliber, 4 mm.  Liver:  No focal lesion identified. Within normal limits in parenchymal echogenicity.  IMPRESSION: Unremarkable right upper quadrant ultrasound.   Electronically Signed   By: Charlett Nose M.D.   On: 06/02/2013 12:34    EKG Interpretation   None       MDM   1. Abdominal pain, chronic, epigastric     Patients labs and/or radiological studies were viewed and considered during the medical decision making and disposition process. Pt was prescribed pepcid due to prior history of reflux.  She was referred to GI for further evaluation of her pain.  Labs normal, [place] negative for gallbladder disease today.   The patient appears reasonably screened and/or stabilized for discharge  and I doubt any other medical condition or other [facility] requiring further screening, evaluation, or treatment in the ED at this time prior to discharge. Pt discussed with Dr  Bernette Mayers prior to dc home.    Burgess Amor, PA-C 06/02/13 1406

## 2013-06-02 NOTE — ED Notes (Signed)
Pt c/o pain in abd and lower back pain off and for past 2 weeks.  Denies any n/v/d.  LMP was around Dec 1st.  Denies abnormal vaginal bleeding or discharge, denies any burning with urination.

## 2013-06-02 NOTE — ED Provider Notes (Signed)
Medical screening examination/treatment/procedure(s) were performed by non-physician practitioner and as supervising physician I was immediately available for consultation/collaboration.  EKG Interpretation   None         Charles B. Sheldon, MD 06/02/13 1500 

## 2014-02-18 ENCOUNTER — Encounter (HOSPITAL_COMMUNITY): Payer: Self-pay | Admitting: Emergency Medicine

## 2014-02-18 ENCOUNTER — Emergency Department (HOSPITAL_COMMUNITY)
Admission: EM | Admit: 2014-02-18 | Discharge: 2014-02-18 | Disposition: A | Discharge status: Home / Self Care | Payer: Medicaid Other | Attending: Emergency Medicine | Admitting: Emergency Medicine

## 2014-02-18 ENCOUNTER — Emergency Department (HOSPITAL_COMMUNITY): Payer: Medicaid Other

## 2014-02-18 DIAGNOSIS — R296 Repeated falls: Secondary | ICD-10-CM | POA: Insufficient documentation

## 2014-02-18 DIAGNOSIS — Z8659 Personal history of other mental and behavioral disorders: Secondary | ICD-10-CM | POA: Insufficient documentation

## 2014-02-18 DIAGNOSIS — Z79899 Other long term (current) drug therapy: Secondary | ICD-10-CM | POA: Insufficient documentation

## 2014-02-18 DIAGNOSIS — Z8781 Personal history of (healed) traumatic fracture: Secondary | ICD-10-CM | POA: Diagnosis not present

## 2014-02-18 DIAGNOSIS — S93409A Sprain of unspecified ligament of unspecified ankle, initial encounter: Secondary | ICD-10-CM | POA: Diagnosis not present

## 2014-02-18 DIAGNOSIS — S99919A Unspecified injury of unspecified ankle, initial encounter: Secondary | ICD-10-CM

## 2014-02-18 DIAGNOSIS — S8990XA Unspecified injury of unspecified lower leg, initial encounter: Secondary | ICD-10-CM | POA: Diagnosis present

## 2014-02-18 DIAGNOSIS — K219 Gastro-esophageal reflux disease without esophagitis: Secondary | ICD-10-CM | POA: Insufficient documentation

## 2014-02-18 DIAGNOSIS — F17200 Nicotine dependence, unspecified, uncomplicated: Secondary | ICD-10-CM | POA: Insufficient documentation

## 2014-02-18 DIAGNOSIS — S99929A Unspecified injury of unspecified foot, initial encounter: Secondary | ICD-10-CM

## 2014-02-18 DIAGNOSIS — J45909 Unspecified asthma, uncomplicated: Secondary | ICD-10-CM | POA: Diagnosis not present

## 2014-02-18 DIAGNOSIS — Y9289 Other specified places as the place of occurrence of the external cause: Secondary | ICD-10-CM | POA: Diagnosis not present

## 2014-02-18 DIAGNOSIS — Y9301 Activity, walking, marching and hiking: Secondary | ICD-10-CM | POA: Diagnosis not present

## 2014-02-18 DIAGNOSIS — S93402A Sprain of unspecified ligament of left ankle, initial encounter: Secondary | ICD-10-CM

## 2014-02-18 MED ORDER — HYDROCODONE-ACETAMINOPHEN 5-325 MG PO TABS: 1.0000 | ORAL_TABLET | ORAL | Status: DC | PRN

## 2014-02-18 NOTE — Discharge Instructions (Signed)
Your x-rays are negative for fracture or dislocation. Your examination is consistent with an ankle sprain. Please use the ankle stirrup splint for the next 10 days. Please use the crutches until you are able to safely apply weight to your left lower extremity. Please apply ice and keep your foot elevated as much as possible. Use Tylenol for mild pain, use Norco for more severe pain. Norco may cause drowsiness, please use with caution. Please see the orthopedic specialist listed above if not improving. Ankle Sprain An ankle sprain is an injury to the strong, fibrous tissues (ligaments) that hold the bones of your ankle joint together.  CAUSES An ankle sprain is usually caused by a fall or by twisting your ankle. Ankle sprains most commonly occur when you step on the outer edge of your foot, and your ankle turns inward. People who participate in sports are more prone to these types of injuries.  SYMPTOMS   Pain in your ankle. The pain may be present at rest or only when you are trying to stand or walk.  Swelling.  Bruising. Bruising may develop immediately or within 1 to 2 days after your injury.  Difficulty standing or walking, particularly when turning corners or changing directions. DIAGNOSIS  Your caregiver will ask you details about your injury and perform a physical exam of your ankle to determine if you have an ankle sprain. During the physical exam, your caregiver will press on and apply pressure to specific areas of your foot and ankle. Your caregiver will try to move your ankle in certain ways. An X-ray exam may be done to be sure a bone was not broken or a ligament did not separate from one of the bones in your ankle (avulsion fracture).  TREATMENT  Certain types of braces can help stabilize your ankle. Your caregiver can make a recommendation for this. Your caregiver may recommend the use of medicine for pain. If your sprain is severe, your caregiver may refer you to a surgeon who helps to  restore function to parts of your skeletal system (orthopedist) or a physical therapist. HOME CARE INSTRUCTIONS   Apply ice to your injury for 1-2 days or as directed by your caregiver. Applying ice helps to reduce inflammation and pain.  Put ice in a plastic bag.  Place a towel between your skin and the bag.  Leave the ice on for 15-20 minutes at a time, every 2 hours while you are awake.  Only take over-the-counter or prescription medicines for pain, discomfort, or fever as directed by your caregiver.  Elevate your injured ankle above the level of your heart as much as possible for 2-3 days.  If your caregiver recommends crutches, use them as instructed. Gradually put weight on the affected ankle. Continue to use crutches or a cane until you can walk without feeling pain in your ankle.  If you have a plaster splint, wear the splint as directed by your caregiver. Do not rest it on anything harder than a pillow for the first 24 hours. Do not put weight on it. Do not get it wet. You may take it off to take a shower or bath.  You may have been given an elastic bandage to wear around your ankle to provide support. If the elastic bandage is too tight (you have numbness or tingling in your foot or your foot becomes cold and blue), adjust the bandage to make it comfortable.  If you have an air splint, you may blow more air  into it or let air out to make it more comfortable. You may take your splint off at night and before taking a shower or bath. Wiggle your toes in the splint several times per day to decrease swelling. SEEK MEDICAL CARE IF:   You have rapidly increasing bruising or swelling.  Your toes feel extremely cold or you lose feeling in your foot.  Your pain is not relieved with medicine. SEEK IMMEDIATE MEDICAL CARE IF:  Your toes are numb or blue.  You have severe pain that is increasing. MAKE SURE YOU:   Understand these instructions.  Will watch your condition.  Will get  help right away if you are not doing well or get worse. Document Released: 05/20/2005 Document Revised: 02/12/2012 Document Reviewed: 06/01/2011 [facility] Patient Information 2015 [place], [place]. This information is not intended to replace advice given to you by your health care provider. Make sure you discuss any questions you have with your health care provider.

## 2014-02-18 NOTE — ED Notes (Signed)
Patient with no complaints at this time. Respirations even and unlabored. Skin warm/dry. Discharge instructions reviewed with patient at this time. Patient given opportunity to voice concerns/ask questions. Patient discharged at this time and left Emergency Department with steady gait.   

## 2014-02-18 NOTE — ED Notes (Signed)
Pt reports fell yesterday while walking up a hill. Pt reports heard a "pop" and reports left ankle pain ever since. nad noted. No obvious deformity noted. Pt able to bear weight to LLE.

## 2014-02-18 NOTE — ED Provider Notes (Signed)
CSN: [id number]     Arrival date & time 02/18/14  7829 History   First MD Initiated Contact with Patient 02/18/14 1024     Chief Complaint  Patient presents with  . Ankle Pain     (Consider location/radiation/quality/duration/timing/severity/associated sxs/prior Treatment) Patient is a 33 y.o. female presenting with ankle pain. The history is provided by the patient.  Ankle Pain Location:  Ankle Time since incident:  1 day Injury: yes   Mechanism of injury: fall   Fall:    Fall occurred: walking down a hill.   Impact surface:  Dirt   Entrapped after fall: no   Ankle location:  L ankle Pain details:    Quality: stabbing pain.   Radiates to:  Does not radiate   Severity:  Moderate   Onset quality:  Sudden   Duration:  1 day   Timing:  Intermittent   Progression:  Unchanged Chronicity:  New Dislocation: no   Foreign body present:  No foreign bodies Relieved by:  Nothing Ineffective treatments:  None tried Associated symptoms: swelling   Associated symptoms: no back pain, no neck pain, no numbness and no tingling   Risk factors: no frequent fractures     Past Medical History  Diagnosis Date  . Foot fracture   . Depression   . GERD (gastroesophageal reflux disease)   . Asthma    Past Surgical History  Procedure Laterality Date  . Wisdom tooth extraction    . Multiple tooth extractions     History reviewed. No pertinent family history. History  Substance Use Topics  . Smoking status: Current Every Day Smoker -- 0.04 packs/day for 8 years    Types: Cigarettes  . Smokeless tobacco: Never Used  . Alcohol Use: No   OB History   Grav Para Term Preterm Abortions TAB SAB Ect Mult Living   Review of Systems  Constitutional: Negative for activity change.       All ROS Neg except as noted in HPI  HENT: Negative for nosebleeds.   Eyes: Negative for photophobia and discharge.  Respiratory: Negative for cough, shortness of breath and wheezing.    Cardiovascular: Negative for chest pain and palpitations.  Gastrointestinal: Negative for abdominal pain and blood in stool.  Genitourinary: Negative for dysuria, frequency and hematuria.  Musculoskeletal: Positive for arthralgias. Negative for back pain and neck pain.  Skin: Negative.   Neurological: Negative for dizziness, seizures and speech difficulty.  Psychiatric/Behavioral: Negative for hallucinations and confusion.      Allergies  Ibuprofen and Tramadol  Home Medications   Prior to Admission medications   Medication Sig Start Date End Date Taking? Authorizing Provider  acetaminophen (TYLENOL) 500 MG tablet Take 500-1,000 mg by mouth every 8 (eight) hours as needed for moderate pain.    Historical Provider, MD  famotidine (PEPCID) 20 MG tablet Take 1 tablet (20 mg total) by mouth 2 (two) times daily. 06/02/13   Burgess Amor, PA-C   BP 144/74  Pulse 105  Resp 18  Ht  (1.549 m)  Wt 160 lb (72.576 kg)  BMI 30.25 kg/m2  SpO2 100%  LMP 01/12/2014 Physical Exam  Nursing note and vitals reviewed. Constitutional: She is oriented to person, place, and time. She appears well-developed and well-nourished.  Non-toxic appearance.  HENT:  Head: Normocephalic.  Right Ear: Tympanic membrane and external ear normal.  Left Ear: Tympanic membrane and external ear normal.  Eyes: EOM and lids are normal. Pupils are equal, round, and reactive to light.  Neck: Normal range of motion. Neck supple. Carotid bruit is not present.  Cardiovascular: Normal rate, regular rhythm, normal heart sounds, intact distal pulses and normal pulses.   Pulmonary/Chest: Breath sounds normal. No respiratory distress.  Abdominal: Soft. Bowel sounds are normal. There is no tenderness. There is no guarding.  Musculoskeletal: She exhibits tenderness.       Left ankle: She exhibits decreased range of motion. She exhibits no deformity. Tenderness. Lateral malleolus tenderness found. Achilles tendon normal.  Pain  and some bruising of the lateral malleolus. Achilles intact. Cap refill less than 2 sec.  Lymphadenopathy:       Head (right side): No submandibular adenopathy present.       Head (left side): No submandibular adenopathy present.    She has no cervical adenopathy.  Neurological: She is alert and oriented to person, place, and time. She has normal strength. No cranial nerve deficit or sensory deficit.  Skin: Skin is warm and dry.  Psychiatric: She has a normal mood and affect. Her speech is normal.    ED Course  Procedures (including critical care time) Labs Review Labs Reviewed - No data to display  Imaging Review Dg Ankle Complete Left  02/18/2014   CLINICAL DATA:  Pain post trauma  EXAM: LEFT ANKLE COMPLETE - 3+ VIEW  COMPARISON:  None.  FINDINGS: Frontal, oblique, and lateral views were obtained. There is no fracture or effusion. Ankle mortise appears intact. There is a small inferior calcaneal spur.  IMPRESSION: No fracture.  Mortise intact.  Small inferior calcaneal spur.   Electronically Signed   By: Bretta Bang M.D.   On: 02/18/2014 10:23   Dg Foot Complete Left  02/18/2014   CLINICAL DATA:  Pain post trauma  EXAM: LEFT FOOT - COMPLETE 3+ VIEW  COMPARISON:  None.  FINDINGS: Frontal, oblique, and lateral views were obtained. There is no fracture or dislocation. Joint spaces appear intact. There is a small inferior calcaneal spur.  IMPRESSION: Small inferior calcaneal spur. No fracture or appreciable arthropathy.   Electronically Signed   By: Bretta Bang M.D.   On: 02/18/2014 10:26     EKG Interpretation None      MDM  No gross neuro-vascular deficit noted. Xray of the left ankle and the left foot reveal a small calcaneal spur, but not fracture or dislocation. Pt fitted with ASO splint, and crutches. Ice pack provided. Rx for norco given to the patient. Pt referred to orthopedics.   Final diagnoses:  None    **I have reviewed nursing notes, vital signs, and all  appropriate lab and imaging results for this patient.Kathie Dike, PA-C 02/18/14 2155

## 2014-02-21 NOTE — ED Provider Notes (Signed)
Medical screening examination/treatment/procedure(s) were performed by non-physician practitioner and as supervising physician I was immediately available for consultation/collaboration.   EKG Interpretation None       Carinna Newhart, MD 02/21/14 1843 

## 2014-04-03 ENCOUNTER — Encounter (HOSPITAL_COMMUNITY): Payer: Self-pay | Admitting: Cardiology

## 2014-04-03 ENCOUNTER — Emergency Department (HOSPITAL_COMMUNITY): Payer: Medicaid Other

## 2014-04-03 ENCOUNTER — Emergency Department (HOSPITAL_COMMUNITY)
Admission: EM | Admit: 2014-04-03 | Discharge: 2014-04-03 | Disposition: A | Discharge status: Home / Self Care | Payer: Medicaid Other | Attending: Emergency Medicine | Admitting: Emergency Medicine

## 2014-04-03 DIAGNOSIS — W19XXXA Unspecified fall, initial encounter: Secondary | ICD-10-CM

## 2014-04-03 DIAGNOSIS — S4991XA Unspecified injury of right shoulder and upper arm, initial encounter: Secondary | ICD-10-CM | POA: Diagnosis present

## 2014-04-03 DIAGNOSIS — S59901A Unspecified injury of right elbow, initial encounter: Secondary | ICD-10-CM | POA: Diagnosis not present

## 2014-04-03 DIAGNOSIS — Y92410 Unspecified street and highway as the place of occurrence of the external cause: Secondary | ICD-10-CM | POA: Diagnosis not present

## 2014-04-03 DIAGNOSIS — Y9389 Activity, other specified: Secondary | ICD-10-CM | POA: Insufficient documentation

## 2014-04-03 DIAGNOSIS — Z8659 Personal history of other mental and behavioral disorders: Secondary | ICD-10-CM | POA: Diagnosis not present

## 2014-04-03 DIAGNOSIS — J45909 Unspecified asthma, uncomplicated: Secondary | ICD-10-CM | POA: Insufficient documentation

## 2014-04-03 DIAGNOSIS — K219 Gastro-esophageal reflux disease without esophagitis: Secondary | ICD-10-CM | POA: Diagnosis not present

## 2014-04-03 DIAGNOSIS — Z8781 Personal history of (healed) traumatic fracture: Secondary | ICD-10-CM | POA: Diagnosis not present

## 2014-04-03 DIAGNOSIS — Z79899 Other long term (current) drug therapy: Secondary | ICD-10-CM | POA: Diagnosis not present

## 2014-04-03 DIAGNOSIS — Z720 Tobacco use: Secondary | ICD-10-CM | POA: Diagnosis not present

## 2014-04-03 DIAGNOSIS — IMO0001 Reserved for inherently not codable concepts without codable children: Secondary | ICD-10-CM

## 2014-04-03 DIAGNOSIS — S40021A Contusion of right upper arm, initial encounter: Secondary | ICD-10-CM

## 2014-04-03 DIAGNOSIS — S40011A Contusion of right shoulder, initial encounter: Secondary | ICD-10-CM | POA: Insufficient documentation

## 2014-04-03 MED ORDER — KETOROLAC TROMETHAMINE 10 MG PO TABS
10.0000 mg | ORAL_TABLET | Freq: Once | ORAL | Status: AC
  Administered 2014-04-03: 10 mg via ORAL
  Filled 2014-04-03: qty 1

## 2014-04-03 MED ORDER — MORPHINE SULFATE 4 MG/ML IJ SOLN
10.0000 mg | Freq: Once | INTRAMUSCULAR | Status: AC
  Administered 2014-04-03: 10 mg via INTRAMUSCULAR
  Filled 2014-04-03: qty 3

## 2014-04-03 MED ORDER — ONDANSETRON HCL 4 MG PO TABS
4.0000 mg | ORAL_TABLET | Freq: Once | ORAL | Status: AC
  Administered 2014-04-03: 4 mg via ORAL
  Filled 2014-04-03: qty 1

## 2014-04-03 MED ORDER — HYDROCODONE-ACETAMINOPHEN 5-325 MG PO TABS: 1.0000 | ORAL_TABLET | ORAL | Status: DC | PRN

## 2014-04-03 MED ORDER — CELECOXIB 100 MG PO CAPS: 100.0000 mg | ORAL_CAPSULE | Freq: Two times a day (BID) | ORAL | Status: DC
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
## 2014-04-03 NOTE — Discharge Instructions (Signed)
Your x-rays are negative for fracture or dislocation. Please apply ice and use the sling until the soreness and swelling resolves. Please use Celebrex 2 times daily with food until all taken. May use Norco for pain if needed. This medication may cause drowsiness. [facility] [place] [facility] for additional evaluation and management if needed. Contusion A contusion is a deep bruise. Contusions happen when an injury causes bleeding under the skin. Signs of bruising include pain, puffiness (swelling), and discolored skin. The contusion may turn blue, purple, or yellow. HOME CARE   Put ice on the injured area.  Put ice in a plastic bag.  Place a towel between your skin and the bag.  Leave the ice on for 15-20 minutes, 03-04 times a day.  Only take medicine as told by your doctor.  Rest the injured area.  If possible, raise (elevate) the injured area to lessen puffiness. GET HELP RIGHT AWAY IF:   You have more bruising or puffiness.  You have pain that is getting worse.  Your puffiness or pain is not helped by medicine. MAKE SURE YOU:   Understand these instructions.  Will watch your condition.  Will get help right away if you are not doing well or get worse. Document Released: 11/06/2007 Document Revised: 08/12/2011 Document Reviewed: 03/25/2011 [facility] Patient Information 2015 [employer] [place]. This information is not intended to replace advice given to you by your health care provider. Make sure you discuss any questions you have with your health care provider.
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
## 2014-04-04 ENCOUNTER — Encounter (HOSPITAL_COMMUNITY): Payer: Self-pay | Admitting: Cardiology

## 2014-11-25 ENCOUNTER — Emergency Department (HOSPITAL_COMMUNITY)
Admission: EM | Admit: 2014-11-25 | Discharge: 2014-11-25 | Disposition: A | Discharge status: Home / Self Care | Payer: Medicaid Other | Attending: Emergency Medicine | Admitting: Emergency Medicine

## 2014-11-25 ENCOUNTER — Encounter (HOSPITAL_COMMUNITY): Payer: Self-pay | Admitting: Emergency Medicine

## 2014-11-25 DIAGNOSIS — T814XXA Infection following a procedure, initial encounter: Secondary | ICD-10-CM | POA: Diagnosis not present

## 2014-11-25 DIAGNOSIS — Z9851 Tubal ligation status: Secondary | ICD-10-CM | POA: Insufficient documentation

## 2014-11-25 DIAGNOSIS — Y838 Other surgical procedures as the cause of abnormal reaction of the patient, or of later complication, without mention of misadventure at the time of the procedure: Secondary | ICD-10-CM | POA: Diagnosis not present

## 2014-11-25 DIAGNOSIS — Z720 Tobacco use: Secondary | ICD-10-CM | POA: Insufficient documentation

## 2014-11-25 DIAGNOSIS — Z79899 Other long term (current) drug therapy: Secondary | ICD-10-CM | POA: Insufficient documentation

## 2014-11-25 DIAGNOSIS — Z8719 Personal history of other diseases of the digestive system: Secondary | ICD-10-CM | POA: Diagnosis not present

## 2014-11-25 DIAGNOSIS — Z8781 Personal history of (healed) traumatic fracture: Secondary | ICD-10-CM | POA: Insufficient documentation

## 2014-11-25 DIAGNOSIS — IMO0001 Reserved for inherently not codable concepts without codable children: Secondary | ICD-10-CM

## 2014-11-25 DIAGNOSIS — Z8659 Personal history of other mental and behavioral disorders: Secondary | ICD-10-CM | POA: Insufficient documentation

## 2014-11-25 MED ORDER — CEPHALEXIN 500 MG PO CAPS: 500.0000 mg | ORAL_CAPSULE | Freq: Four times a day (QID) | ORAL | Status: DC

## 2014-11-25 MED ORDER — HYDROCODONE-ACETAMINOPHEN 5-325 MG PO TABS: 1.0000 | ORAL_TABLET | ORAL | Status: DC | PRN

## 2014-11-25 NOTE — ED Notes (Signed)
Patient states "I had surgery to remove my tubes a month ago and I'm having bad pain in the incision in the right side of my abdomen since last week. I think it's infected." States it has been draining "brown-colored liquid."

## 2014-11-25 NOTE — ED Notes (Signed)
Pain RLQ at site of tubal ligation done at [facility].  Pt reports d/c from site.and tenderness.

## 2014-11-25 NOTE — Discharge Instructions (Signed)
Wound Infection °A wound infection happens when a type of germ (bacteria) starts growing in the wound. In some cases, this can cause the wound to break open. If cared for properly, the infected wound will heal from the inside to the outside. Wound infections need treatment. °CAUSES °An infection is caused by bacteria growing in the wound.  °SYMPTOMS  °· Increase in redness, swelling, or pain at the wound site. °· Increase in drainage at the wound site. °· Wound or bandage (dressing) starts to smell bad. °· Fever. °· Feeling tired or fatigued. °· Pus draining from the wound. °TREATMENT  °Your health care provider will prescribe antibiotic medicine. The wound infection should improve within 24 to 48 hours. Any redness around the wound should stop spreading and the wound should be less painful.  °HOME CARE INSTRUCTIONS  °· Only take over-the-counter or prescription medicines for pain, discomfort, or fever as directed by your health care provider. °· Take your antibiotics as directed. Finish them even if you start to feel better. °· Gently wash the area with mild soap and water 2 times a day, or as directed. Rinse off the soap. Pat the area dry with a clean towel. Do not rub the wound. This may cause bleeding. °· Follow your health care provider's instructions for how often you need to change the dressing. °· Apply ointment and a dressing to the wound as directed. °· If the dressing sticks, moisten it with soapy water and gently remove it. °· Change the bandage right away if it becomes wet, dirty, or develops a bad smell. °· Take showers. Do not take tub baths, swim, or do anything that may soak the wound until it is healed. °· Avoid exercises that make you sweat heavily. °· Use anti-itch medicine as directed by your health care provider. The wound may itch when it is healing. Do not pick or scratch at the wound. °· Follow up with your health care provider to get your wound rechecked as directed. °SEEK MEDICAL CARE  IF: °· You have an increase in swelling, pain, or redness around the wound. °· You have an increase in the amount of pus coming from the wound. °· There is a bad smell coming from the wound. °· More of the wound breaks open. °· You have a fever. °MAKE SURE YOU:  °· Understand these instructions. °· Will watch your condition. °· Will get help right away if you are not doing well or get worse. °Document Released: 02/16/2003 Document Revised: 05/25/2013 Document Reviewed: 09/23/2010 °Exit[employer]® Patient Information ©2015 [employer]. This information is not intended to replace advice given to you by your health care provider. Make sure you discuss any questions you have with your health care provider. ° °
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
## 2015-08-29 ENCOUNTER — Emergency Department (HOSPITAL_COMMUNITY)
Admission: EM | Admit: 2015-08-29 | Discharge: 2015-08-29 | Disposition: A | Discharge status: Home / Self Care | Payer: Medicaid Other | Attending: Emergency Medicine | Admitting: Emergency Medicine

## 2015-08-29 ENCOUNTER — Encounter (HOSPITAL_COMMUNITY): Payer: Self-pay | Admitting: Emergency Medicine

## 2015-08-29 DIAGNOSIS — F329 Major depressive disorder, single episode, unspecified: Secondary | ICD-10-CM | POA: Diagnosis not present

## 2015-08-29 DIAGNOSIS — R110 Nausea: Secondary | ICD-10-CM | POA: Insufficient documentation

## 2015-08-29 DIAGNOSIS — J45909 Unspecified asthma, uncomplicated: Secondary | ICD-10-CM | POA: Diagnosis not present

## 2015-08-29 DIAGNOSIS — M549 Dorsalgia, unspecified: Secondary | ICD-10-CM | POA: Insufficient documentation

## 2015-08-29 DIAGNOSIS — F17210 Nicotine dependence, cigarettes, uncomplicated: Secondary | ICD-10-CM | POA: Diagnosis not present

## 2015-08-29 DIAGNOSIS — R109 Unspecified abdominal pain: Secondary | ICD-10-CM | POA: Insufficient documentation

## 2015-08-29 DIAGNOSIS — R300 Dysuria: Secondary | ICD-10-CM | POA: Insufficient documentation

## 2015-08-29 DIAGNOSIS — R350 Frequency of micturition: Secondary | ICD-10-CM | POA: Diagnosis present

## 2015-08-29 LAB — URINALYSIS, ROUTINE W REFLEX MICROSCOPIC
BILIRUBIN URINE: NEGATIVE
Glucose, UA: NEGATIVE mg/dL
Ketones, ur: NEGATIVE mg/dL
Leukocytes, UA: NEGATIVE
NITRITE: NEGATIVE
PROTEIN: NEGATIVE mg/dL
SPECIFIC GRAVITY, URINE: 1.01 (ref 1.005–1.030)
pH: 8 (ref 5.0–8.0)

## 2015-08-29 LAB — URINE MICROSCOPIC-ADD ON: WBC UA: NONE SEEN WBC/hpf (ref 0–5)

## 2015-08-29 MED ORDER — HYDROCODONE-ACETAMINOPHEN 5-325 MG PO TABS: 1.0000 | ORAL_TABLET | ORAL | Status: DC | PRN

## 2015-08-29 MED ORDER — ONDANSETRON HCL 4 MG PO TABS: 4.0000 mg | ORAL_TABLET | Freq: Four times a day (QID) | ORAL | Status: DC

## 2015-08-29 NOTE — ED Provider Notes (Signed)
CSN: [id number]     Arrival date & time 08/29/15  1104 History   First MD Initiated Contact with Patient 08/29/15 1241     Chief Complaint  Patient presents with  . Urinary Frequency     (Consider location/radiation/quality/duration/timing/severity/associated sxs/prior Treatment) Patient is a 35 y.o. female presenting with frequency. The history is provided by the patient.  Urinary Frequency The current episode started in the past 7 days. The problem occurs constantly. The problem has been gradually worsening. Associated symptoms include nausea. Pertinent negatives include no fever or vomiting. Associated symptoms comments: Back pain and side pain.. Nothing aggravates the symptoms. She has tried nothing for the symptoms. The treatment provided moderate relief.    Past Medical History  Diagnosis Date  . Foot fracture   . Depression   . GERD (gastroesophageal reflux disease)   . Asthma    Past Surgical History  Procedure Laterality Date  . Wisdom tooth extraction    . Multiple tooth extractions    . Laparoscopic tubal ligation     History reviewed. No pertinent family history. Social History  Substance Use Topics  . Smoking status: Current Every Day Smoker -- 0.04 packs/day for 8 years    Types: Cigarettes  . Smokeless tobacco: Never Used  . Alcohol Use: No   OB History    Gravida Para Term Preterm AB TAB SAB Ectopic Multiple Living   Review of Systems  Constitutional: Negative for fever.  Gastrointestinal: Positive for nausea. Negative for vomiting.  Genitourinary: Positive for dysuria and frequency.  Musculoskeletal: Positive for back pain.  All other systems reviewed and are negative.     Allergies  Ibuprofen and Tramadol  Home Medications   Prior to Admission medications   Not on File   BP 133/72 mmHg  Pulse 100  Temp(Src) 98.6 F (37 C) (Oral)  Resp 16  Ht  (1.575 m)  Wt 74.39 kg  BMI 29.99 kg/m2  SpO2 100%  LMP  08/23/2015 Physical Exam  Constitutional: She is oriented to person, place, and time. She appears well-developed and well-nourished.  Non-toxic appearance.  HENT:  Head: Normocephalic.  Right Ear: Tympanic membrane and external ear normal.  Left Ear: Tympanic membrane and external ear normal.  Eyes: EOM and lids are normal. Pupils are equal, round, and reactive to light.  Neck: Normal range of motion. Neck supple. Carotid bruit is not present.  Cardiovascular: Normal rate, regular rhythm, normal heart sounds, intact distal pulses and normal pulses.   Pulmonary/Chest: Breath sounds normal. No respiratory distress.  Abdominal: Soft. Bowel sounds are normal. There is no tenderness. There is no guarding.  Right greater than left CVAT.  Musculoskeletal: Normal range of motion.  Lymphadenopathy:       Head (right side): No submandibular adenopathy present.       Head (left side): No submandibular adenopathy present.    She has no cervical adenopathy.  Neurological: She is alert and oriented to person, place, and time. She has normal strength. No cranial nerve deficit or sensory deficit.  Skin: Skin is warm and dry.  Psychiatric: She has a normal mood and affect. Her speech is normal.  Nursing note and vitals reviewed.   ED Course  Procedures (including critical care time) Labs Review Labs Reviewed  URINALYSIS, ROUTINE W REFLEX MICROSCOPIC (NOT AT [facility]) - Abnormal; Notable for the following:    Hgb urine dipstick LARGE (*)  All other components within normal limits  URINE MICROSCOPIC-ADD ON - Abnormal; Notable for the following:    Squamous Epithelial / LPF TOO NUMEROUS TO COUNT (*)    Bacteria, UA MANY (*)    All other components within normal limits  URINE CULTURE    Imaging Review No results found. I have personally reviewed and evaluated these images and lab results as part of my medical decision-making.   EKG Interpretation None      MDM  Discussed the findings with  the patient. Discussed the need for CT scan. Patient states that she has someone watching her child was not accustomed to watching children, and that she needs to leave. The patient except responsibility for changes or problems, and she is leaving before the workup could be completed. The patient states she will see the GYN physician for completion of her workup. The patient is referred to Dr. Despina HiddenEure. The patient is invited to return to the emergency department if any changes, problems, or concerns.    Final diagnoses:  Right flank pain    *I have reviewed nursing notes, vital signs, and all appropriate lab and imaging results for this patient.[address]    Oluwatobi Visser, PA-C 08/31/15 2213  Zadie Rhineonald Wickline, MD 09/02/15 [phone]

## 2015-08-29 NOTE — ED Notes (Signed)
//  Having burning and foul smelling urine.  C/o back pain and have not taken medication this am.  Rates pain 7/10.

## 2015-08-29 NOTE — Discharge Instructions (Signed)
Please see Dr. Despina HiddenEure to complete the workup of your flank pain. A culture of your urine will be sent to the lab. Please monitor for temperature elevations. Use Tylenol for mild pain, use Norco for more severe pain. Use Zofran for nausea/vomiting. Flank Pain Flank pain refers to pain that is located on the side of the body between the upper abdomen and the back. The pain may occur over a short period of time (acute) or may be long-term or reoccurring (chronic). It may be mild or severe. Flank pain can be caused by many things. CAUSES  Some of the more common causes of flank pain include:  Muscle strains.   Muscle spasms.   A disease of your spine (vertebral disk disease).   A lung infection (pneumonia).   Fluid around your lungs (pulmonary edema).   A kidney infection.   Kidney stones.   A very painful skin rash caused by the chickenpox virus (shingles).   Gallbladder disease.  HOME CARE INSTRUCTIONS  Home care will depend on the cause of your pain. In general,  Rest as directed by your caregiver.  Drink enough fluids to keep your urine clear or pale yellow.  Only take over-the-counter or prescription medicines as directed by your caregiver. Some medicines may help relieve the pain.  Tell your caregiver about any changes in your pain.  Follow up with your caregiver as directed. SEEK IMMEDIATE MEDICAL CARE IF:   Your pain is not controlled with medicine.   You have new or worsening symptoms.  Your pain increases.   You have abdominal pain.   You have shortness of breath.   You have persistent nausea or vomiting.   You have swelling in your abdomen.   You feel faint or pass out.   You have blood in your urine.  You have a fever or persistent symptoms for more than 2-3 days.  You have a fever and your symptoms suddenly get worse. MAKE SURE YOU:   Understand these instructions.  Will watch your condition.  Will get help right away if you are  not doing well or get worse.   This information is not intended to replace advice given to you by your health care provider. Make sure you discuss any questions you have with your health care provider.   Document Released: 07/11/2005 Document Revised: 02/12/2012 Document Reviewed: 01/02/2012 Elsevier Interactive Patient Education [employer]2016 [employer] Inc.

## 2015-08-31 LAB — URINE CULTURE

## 2015-09-21 ENCOUNTER — Emergency Department (HOSPITAL_COMMUNITY)
Admission: EM | Admit: 2015-09-21 | Discharge: 2015-09-21 | Disposition: A | Discharge status: Home / Self Care | Payer: Medicaid Other | Attending: Emergency Medicine | Admitting: Emergency Medicine

## 2015-09-21 ENCOUNTER — Encounter (HOSPITAL_COMMUNITY): Payer: Self-pay | Admitting: Cardiology

## 2015-09-21 DIAGNOSIS — F17210 Nicotine dependence, cigarettes, uncomplicated: Secondary | ICD-10-CM | POA: Diagnosis not present

## 2015-09-21 DIAGNOSIS — F329 Major depressive disorder, single episode, unspecified: Secondary | ICD-10-CM | POA: Insufficient documentation

## 2015-09-21 DIAGNOSIS — M545 Low back pain, unspecified: Secondary | ICD-10-CM

## 2015-09-21 DIAGNOSIS — J45909 Unspecified asthma, uncomplicated: Secondary | ICD-10-CM | POA: Diagnosis not present

## 2015-09-21 MED ORDER — METHOCARBAMOL 500 MG PO TABS: 500.0000 mg | ORAL_TABLET | Freq: Two times a day (BID) | ORAL | Status: DC

## 2015-09-21 MED ORDER — HYDROCODONE-ACETAMINOPHEN 5-325 MG PO TABS: 1.0000 | ORAL_TABLET | ORAL | Status: DC | PRN

## 2015-09-21 NOTE — Discharge Instructions (Signed)
Back Injury Prevention °Back injuries can be very painful. They can also be difficult to heal. After having one back injury, you are more likely to injure your back again. It is important to learn how to avoid injuring or re-injuring your back. The following tips can help you to prevent a back injury. °WHAT SHOULD I KNOW ABOUT PHYSICAL FITNESS? °· Exercise for 30 minutes per day on most days of the week or as directed by your health care provider. Make sure to: °· Do aerobic exercises, such as walking, jogging, biking, or swimming. °· Do exercises that increase balance and strength, such as tai chi and yoga. These can decrease your risk of falling and injuring your back. °· Do stretching exercises to help with flexibility. °· Try to develop strong abdominal muscles. Your abdominal muscles provide a lot of the support that is needed by your back. °· Maintain a healthy weight.  This helps to decrease your risk of a back injury. °WHAT SHOULD I KNOW ABOUT MY DIET? °· Talk with your health care provider about your overall diet. Take supplements and vitamins only as directed by your health care provider. °· Talk with your health care provider about how much calcium and vitamin D you need each day. These nutrients help to prevent weakening of the bones (osteoporosis). Osteoporosis can cause broken (fractured) bones, which lead to back pain. °· Include good sources of calcium in your diet, such as dairy products, green leafy vegetables, and products that have had calcium added to them (fortified). °· Include good sources of vitamin D in your diet, such as milk and foods that are fortified with vitamin D. °WHAT SHOULD I KNOW ABOUT MY POSTURE? °· Sit up straight and stand up straight. Avoid leaning forward when you sit or hunching over when you stand. °· Choose chairs that have good low-back (lumbar) support. °· If you work at a desk, sit close to it so you do not need to lean over. Keep your chin tucked in. Keep your neck  drawn back, and keep your elbows bent at a right angle. Your arms should look like the letter "L." °· Sit high and close to the steering wheel when you drive. Add a lumbar support to your car seat, if needed. °· Avoid sitting or standing in one position for very long. Take breaks to get up, stretch, and walk around at least one time every hour. Take breaks every hour if you are driving for long periods of time. °· Sleep on your side with your knees slightly bent, or sleep on your back with a pillow under your knees. Do not lie on the front of your body to sleep. °WHAT SHOULD I KNOW ABOUT LIFTING, TWISTING, AND REACHING? °Lifting and Heavy Lifting °· Avoid heavy lifting, especially repetitive heavy lifting. If you must do heavy lifting: °· Stretch before lifting. °· Work slowly. °· Rest between lifts. °· Use a tool such as a cart or a dolly to move objects if one is available. °· Make several small trips instead of carrying one heavy load. °· Ask for help when you need it, especially when moving big objects. °· Follow these steps when lifting: °· Stand with your feet shoulder-width apart. °· Get as close to the object as you can. Do not try to pick up a heavy object that is far from your body. °· Use handles or lifting straps if they are available. °· Bend at your knees. Squat down, but keep your heels off the floor. °·   heavy object that is far from your body.   Use handles or lifting straps if they are available.   Bend at your knees. Squat down, but keep your heels off the floor.   Keep your shoulders pulled back, your chin tucked in, and your back straight.   Lift the object slowly while you tighten the muscles in your legs, abdomen, and buttocks. Keep the object as close to the center of your body as possible.   Follow these steps when putting down a heavy load:   Stand with your feet shoulder-width apart.   Lower the object slowly while you tighten the muscles in your legs, abdomen, and buttocks. Keep the object as close to the center of your body as possible.   Keep your shoulders pulled back, your chin tucked in, and your back straight.   Bend at your knees. Squat  down, but keep your heels off the floor.   Use handles or lifting straps if they are available.  Twisting and Reaching   Avoid lifting heavy objects above your waist.   Do not twist at your waist while you are lifting or carrying a load. If you need to turn, move your feet.   Do not bend over without bending at your knees.   Avoid reaching over your head, across a table, or for an object on a high surface.  WHAT ARE SOME OTHER TIPS?   Avoid wet floors and icy ground. Keep sidewalks clear of ice to prevent falls.   Do not sleep on a mattress that is too soft or too hard.   Keep items that are used frequently within easy reach.   Put heavier objects on shelves at waist level, and put lighter objects on lower or higher shelves.   Find ways to decrease your stress, such as exercise, massage, or relaxation techniques. Stress can build up in your muscles. Tense muscles are more vulnerable to injury.   Talk with your health care provider if you feel anxious or depressed. These conditions can make back pain worse.   Wear flat heel shoes with cushioned soles.   Avoid sudden movements.   Use both shoulder straps when carrying a backpack.   Do not use any tobacco products, including cigarettes, chewing tobacco, or electronic cigarettes. If you need help quitting, ask your health care provider.    This information is not intended to replace advice given to you by your health care provider. Make sure you discuss any questions you have with your health care provider.    Document Released: 06/27/2004 Document Revised: 10/04/2014 Document Reviewed: 05/24/2014  Elsevier Interactive Patient Education 2016 Elsevier Inc.        Back Pain, Adult  Back pain is very common in adults.The cause of back pain is rarely dangerous and the pain often gets better over time.The cause of your back pain may not be known. Some common causes of back pain include:   Strain of the muscles or ligaments supporting the spine.   Wear  and tear (degeneration) of the spinal disks.   Arthritis.        Direct injury to the back.  For many people, back pain may return. Since back pain is rarely dangerous, most people can learn to manage this condition on their own.  HOME CARE INSTRUCTIONS  Watch your back pain for any changes. The following actions may help to lessen any discomfort you are feeling:   Remain active. It is stressful on your back to   stay in bed.Resting more than 1-2 days can delay your recovery.  Pay attention to your body when you bend and lift. The most comfortable positions are those that put less stress on your recovering back. Always use proper lifting techniques, including:  Bending your knees.  Keeping the load close to your body.  Avoiding twisting.  Find a comfortable position to sleep. Use a firm mattress and lie on your side with your knees slightly bent. If you lie on your back, put a pillow under your knees.  Avoid feeling anxious or stressed.Stress increases muscle tension and can worsen back pain.It is important to recognize when you are anxious or stressed and learn ways to manage it, such as with exercise.  Take medicines only as directed by your health care provider. Over-the-counter medicines to reduce pain and inflammation are often the most helpful.Your health care provider may prescribe muscle relaxant drugs.These medicines help dull your pain so you can more quickly return to your normal activities and healthy exercise.  Apply ice to the injured area:  Put ice in a plastic  bag.  Place a towel between your skin and the bag.  Leave the ice on for 20 minutes, 2-3 times a day for the first 2-3 days. After that, ice and heat may be alternated to reduce pain and spasms.  Maintain a healthy weight. Excess weight puts extra stress on your back and makes it difficult to maintain good posture. SEEK MEDICAL CARE IF:  You have pain that is not relieved with rest or medicine.  You have increasing pain going down into the legs or buttocks.  You have pain that does not improve in one week.  You have night pain.  You lose weight.  You have a fever or chills. SEEK IMMEDIATE MEDICAL CARE IF:   You develop new bowel or bladder control problems.  You have unusual weakness or numbness in your arms or legs.  You develop nausea or vomiting.  You develop abdominal pain.  You feel faint.   This information is not intended to replace advice given to you by your health care provider. Make sure you discuss any questions you have with your health care provider.   Document Released: 05/20/2005 Document Revised: 06/10/2014 Document Reviewed: 09/21/2013 Elsevier Interactive Patient Education [employer].

## 2015-09-21 NOTE — ED Notes (Signed)
Mid back pain that radiates up into both shoulders for 3 days.

## 2015-09-21 NOTE — ED Provider Notes (Signed)
CSN: [id number]     Arrival date & time 09/21/15  1020 History   First MD Initiated Contact with Patient 09/21/15 1121     Chief Complaint  Patient presents with  . Back Pain   (Consider location/radiation/quality/duration/timing/severity/associated sxs/prior Treatment) Patient is a 35 y.o. female presenting with back pain. The history is provided by the patient. No language interpreter was used.  Back Pain Location:  Lumbar spine Quality:  Aching Radiates to:  Does not radiate Pain severity:  Moderate Pain is:  Worse during the day Onset quality:  Gradual Timing:  Constant Progression:  Worsening Chronicity:  New Relieved by:  Nothing Worsened by:  Bending Ineffective treatments:  None tried Associated symptoms: no leg pain and no numbness   Risk factors: no hx of cancer     Past Medical History  Diagnosis Date  . Foot fracture   . Depression   . GERD (gastroesophageal reflux disease)   . Asthma    Past Surgical History  Procedure Laterality Date  . Wisdom tooth extraction    . Multiple tooth extractions    . Laparoscopic tubal ligation     History reviewed. No pertinent family history. Social History  Substance Use Topics  . Smoking status: Current Every Day Smoker -- 0.04 packs/day for 8 years    Types: Cigarettes  . Smokeless tobacco: Never Used  . Alcohol Use: No   OB History    Gravida Para Term Preterm AB TAB SAB Ectopic Multiple Living   2 2 2       2      Review of Systems  Musculoskeletal: Positive for back pain.  Neurological: Negative for numbness.    Allergies  Ibuprofen and Tramadol  Home Medications   Prior to Admission medications   Medication Sig Start Date End Date Taking? Authorizing Provider  HYDROcodone-acetaminophen (NORCO/VICODIN) 5-325 MG tablet Take 1 tablet by mouth every 4 (four) hours as needed. 09/21/15   Elson AreasLeslie K Sofia, PA-C  methocarbamol (ROBAXIN) 500 MG tablet Take 1 tablet (500 mg total) by mouth 2 (two) times daily.  09/21/15   Elson AreasLeslie K Sofia, PA-C  ondansetron (ZOFRAN) 4 MG tablet Take 1 tablet (4 mg total) by mouth every 6 (six) hours. Patient not taking: Reported on 09/21/2015 08/29/15   Ivery QualeHobson Bryant, PA-C   Meds Ordered and Administered this Visit  Medications - No data to display  BP 123/48 mmHg  Pulse 81  Temp(Src) 98.3 F (36.8 C) (Oral)  Resp 16  Ht 5\' 2"  (1.575 m)  Wt 163 lb (73.936 kg)  BMI 29.81 kg/m2  SpO2 100%  LMP 08/21/2015 No data found.   Physical Exam  ED Course  Procedures (including critical care time)  Labs Review Labs Reviewed - No data to display  Imaging Review No results found.   Visual Acuity Review  Right Eye Distance:   Left Eye Distance:   Bilateral Distance:    Right Eye Near:   Left Eye Near:    Bilateral Near:         MDM   1. Midline low back pain without sciatica    An After Visit Summary was printed and given to the patient. Meds ordered this encounter  Medications  . HYDROcodone-acetaminophen (NORCO/VICODIN) 5-325 MG tablet    Sig: Take 1 tablet by mouth every 4 (four) hours as needed.    Dispense:  10 tablet    Refill:  0    Order Specific Question:  Supervising Provider    Answer:  MILLER, BRIAN [[id number]]  . methocarbamol (ROBAXIN) 500 MG tablet    Sig: Take 1 tablet (500 mg total) by mouth 2 (two) times daily.    Dispense:  20 tablet    Refill:  0    Order Specific Question:  Supervising Provider    Answer:  Eber Hong [[id number]]  An After Visit Summary was printed and given to the patient.  Lonia Skinner [place], PA-C 09/21/15 1947  Bethann Berkshire, MD 09/22/15 [phone]

## 2015-11-01 ENCOUNTER — Encounter (HOSPITAL_COMMUNITY): Payer: Self-pay

## 2015-11-01 ENCOUNTER — Emergency Department (HOSPITAL_COMMUNITY)
Admission: EM | Admit: 2015-11-01 | Discharge: 2015-11-01 | Disposition: A | Discharge status: Home / Self Care | Payer: Medicaid Other | Attending: Emergency Medicine | Admitting: Emergency Medicine

## 2015-11-01 DIAGNOSIS — F17210 Nicotine dependence, cigarettes, uncomplicated: Secondary | ICD-10-CM | POA: Diagnosis not present

## 2015-11-01 DIAGNOSIS — K047 Periapical abscess without sinus: Secondary | ICD-10-CM

## 2015-11-01 DIAGNOSIS — F329 Major depressive disorder, single episode, unspecified: Secondary | ICD-10-CM | POA: Insufficient documentation

## 2015-11-01 DIAGNOSIS — K029 Dental caries, unspecified: Secondary | ICD-10-CM | POA: Insufficient documentation

## 2015-11-01 DIAGNOSIS — H9202 Otalgia, left ear: Secondary | ICD-10-CM | POA: Diagnosis present

## 2015-11-01 DIAGNOSIS — H9201 Otalgia, right ear: Secondary | ICD-10-CM | POA: Insufficient documentation

## 2015-11-01 DIAGNOSIS — H9203 Otalgia, bilateral: Secondary | ICD-10-CM

## 2015-11-01 DIAGNOSIS — J45909 Unspecified asthma, uncomplicated: Secondary | ICD-10-CM | POA: Diagnosis not present

## 2015-11-01 MED ORDER — AMOXICILLIN 500 MG PO CAPS: 500.0000 mg | ORAL_CAPSULE | Freq: Three times a day (TID) | ORAL | Status: DC

## 2015-11-01 MED ORDER — IBUPROFEN 400 MG PO TABS
400.0000 mg | ORAL_TABLET | Freq: Once | ORAL | Status: AC
  Administered 2015-11-01: 400 mg via ORAL
  Filled 2015-11-01: qty 1

## 2015-11-01 MED ORDER — ACETAMINOPHEN-CODEINE #3 300-30 MG PO TABS: 1.0000 | ORAL_TABLET | Freq: Four times a day (QID) | ORAL | Status: DC | PRN

## 2015-11-01 MED ORDER — AMOXICILLIN 250 MG PO CAPS
500.0000 mg | ORAL_CAPSULE | Freq: Once | ORAL | Status: AC
Start: 2015-11-01 — End: 2015-11-01
  Administered 2015-11-01: 500 mg via ORAL
  Filled 2015-11-01: qty 2

## 2015-11-01 MED ORDER — ACETAMINOPHEN 325 MG PO TABS
650.0000 mg | ORAL_TABLET | Freq: Once | ORAL | Status: AC
  Administered 2015-11-01: 650 mg via ORAL
  Filled 2015-11-01: qty 2

## 2015-11-01 MED ORDER — ONDANSETRON HCL 4 MG PO TABS
4.0000 mg | ORAL_TABLET | Freq: Once | ORAL | Status: AC
  Administered 2015-11-01: 4 mg via ORAL
  Filled 2015-11-01: qty 1

## 2015-11-01 NOTE — Discharge Instructions (Signed)
Your examination reveals some congestion in your nasal passages. As well as multiple dental infected areas on. Your ears are not draining, and there is no redness appreciated of the eardrum. Please use Amoxil 3 times daily. Please use Tylenol for mild pain, use on Tylenol codeine for more severe pain. Please see your Medicaid access physician if you continue to have pain before your dental visit. Dental Caries Dental caries is tooth decay. This decay can cause a hole in teeth (cavity) that can get bigger and deeper over time. HOME CARE  Brush and floss your teeth. Do this at least two times a day.  Use a fluoride toothpaste.  Use a mouth rinse if told by your dentist or doctor.  Eat less sugary and starchy foods. Drink less sugary drinks.  Avoid snacking often on sugary and starchy foods. Avoid sipping often on sugary drinks.  Keep regular checkups and cleanings with your dentist.  Use fluoride supplements if told by your dentist or doctor.  Allow fluoride to be applied to teeth if told by your dentist or doctor.   This information is not intended to replace advice given to you by your health care provider. Make sure you discuss any questions you have with your health care provider.   Document Released: 02/27/2008 Document Revised: 06/10/2014 Document Reviewed: 05/22/2012 Elsevier Interactive Patient Education [employer]2016 [employer] Inc.

## 2015-11-01 NOTE — ED Provider Notes (Signed)
CSN: [id number]     Arrival date & time 11/01/15  1017 History   None    Chief Complaint  Patient presents with  . Otalgia     (Consider location/radiation/quality/duration/timing/severity/associated sxs/prior Treatment) Patient is a 35 y.o. female presenting with ear pain. The history is provided by the patient.  Otalgia Location:  Bilateral Behind ear:  No abnormality Quality:  Aching and shooting Severity:  Moderate Onset quality:  Gradual Duration:  1 week Timing:  Intermittent Progression:  Worsening Context: not direct blow, not elevation change and no water in ear   Context comment:  Uses q-tips regularly Relieved by:  Nothing Worsened by:  Nothing tried Ineffective treatments:  OTC medications Associated symptoms: no abdominal pain, no cough, no fever, no neck pain and no sore throat   Risk factors: no recent travel     Past Medical History  Diagnosis Date  . Foot fracture   . Depression   . GERD (gastroesophageal reflux disease)   . Asthma    Past Surgical History  Procedure Laterality Date  . Wisdom tooth extraction    . Multiple tooth extractions    . Laparoscopic tubal ligation     No family history on file. Social History  Substance Use Topics  . Smoking status: Current Every Day Smoker -- 0.04 packs/day for 8 years    Types: Cigarettes  . Smokeless tobacco: Never Used  . Alcohol Use: No   OB History    Gravida Para Term Preterm AB TAB SAB Ectopic Multiple Living   2 2 2       2      Review of Systems  Constitutional: Negative for fever and activity change.       All ROS Neg except as noted in HPI  HENT: Positive for dental problem and ear pain. Negative for nosebleeds and sore throat.   Eyes: Negative for photophobia and discharge.  Respiratory: Negative for cough, shortness of breath and wheezing.   Cardiovascular: Negative for chest pain and palpitations.  Gastrointestinal: Negative for abdominal pain and blood in stool.  Genitourinary:  Negative for dysuria, frequency and hematuria.  Musculoskeletal: Negative for back pain, arthralgias and neck pain.  Skin: Negative.   Neurological: Negative for dizziness, seizures and speech difficulty.  Psychiatric/Behavioral: Negative for hallucinations and confusion.       Deptreeion      Allergies  Ibuprofen and Tramadol  Home Medications   Prior to Admission medications   Medication Sig Start Date End Date Taking? Authorizing Provider  HYDROcodone-acetaminophen (NORCO/VICODIN) 5-325 MG tablet Take 1 tablet by mouth every 4 (four) hours as needed. 09/21/15   Elson AreasLeslie K Sofia, PA-C  methocarbamol (ROBAXIN) 500 MG tablet Take 1 tablet (500 mg total) by mouth 2 (two) times daily. 09/21/15   Elson AreasLeslie K Sofia, PA-C  ondansetron (ZOFRAN) 4 MG tablet Take 1 tablet (4 mg total) by mouth every 6 (six) hours. Patient not taking: Reported on 09/21/2015 08/29/15   Ivery QualeHobson Kitai Purdom, PA-C   BP 115/68 mmHg  Pulse 86  Temp(Src) 98.6 F (37 C) (Oral)  Resp 18  Ht 5\' 2"  (1.575 m)  Wt 73.936 kg  BMI 29.81 kg/m2  SpO2 99%  LMP 10/18/2015 Physical Exam  Constitutional: She is oriented to person, place, and time. She appears well-developed and well-nourished.  Non-toxic appearance.  HENT:  Head: Normocephalic.  Right Ear: There is drainage, swelling and tenderness. No foreign bodies. No mastoid tenderness. No hemotympanum.  Left Ear: There is drainage, swelling and tenderness.  No foreign bodies. No mastoid tenderness. No hemotympanum.  Increase redness with mild drainage present. Mild to mod swelling of the TM  Eyes: EOM and lids are normal. Pupils are equal, round, and reactive to light.  Neck: Normal range of motion. Neck supple. Carotid bruit is not present.  Cardiovascular: Normal rate, regular rhythm, normal heart sounds, intact distal pulses and normal pulses.   Pulmonary/Chest: Breath sounds normal. No respiratory distress.  Abdominal: Soft. Bowel sounds are normal. There is no tenderness.  There is no guarding.  Musculoskeletal: Normal range of motion.  Lymphadenopathy:       Head (right side): No submandibular adenopathy present.       Head (left side): No submandibular adenopathy present.    She has no cervical adenopathy.  Neurological: She is alert and oriented to person, place, and time. She has normal strength. No cranial nerve deficit or sensory deficit.  Skin: Skin is warm and dry.  Psychiatric: She has a normal mood and affect. Her speech is normal.  Nursing note and vitals reviewed.   ED Course After receiving Amoxil, ibuprofen and Zofran, the patient states that she not only has nausea with ibuprofen, but it hurts her stomach and causes her to feel "funny". She requests to have a different type of pain medication. She also states that she has problems with taking Ultram.   Procedures (including critical care time) Labs Review Labs Reviewed - No data to display  Imaging Review No results found. I have personally reviewed and evaluated these images and lab results as part of my medical decision-making.   EKG Interpretation None      MDM  Vital signs within normal limits. Pulse oximetry is 99% on room air. The patient has increased redness and some swelling of both tympanic membranes. She denies excessive swimming. After questioning, she does admit that she uses Q-tips on a regular basis. And she at times scratches the ear with her finger.   Examination favors bilateral otitis externa on. I've made patient aware of the trauma using Q-tips on a regular basis. In the advised her very strongly to stop using Q-tips. I've also asked patient to keep anything else out of her ears other than her washcloth.  The patient will be treated with Amoxil, ibuprofen, and Zofran. Patient is to follow-up with your nose and throat if not improving.    Final diagnoses:  Otalgia of both ears  Infected dental caries    **I have reviewed nursing notes, vital signs, and all  appropriate lab and imaging results for this patient.Ivery Quale, PA-C 11/04/15 2142  Blane Ohara, MD 11/05/15 [phone]

## 2015-11-01 NOTE — ED Notes (Signed)
Pt reports bilateral ear pain x 1 week.  Reports R worse than L.

## 2015-11-30 ENCOUNTER — Ambulatory Visit (INDEPENDENT_AMBULATORY_CARE_PROVIDER_SITE_OTHER): Payer: Medicaid Other | Admitting: Otolaryngology

## 2015-12-18 ENCOUNTER — Emergency Department (HOSPITAL_COMMUNITY)
Admission: EM | Admit: 2015-12-18 | Discharge: 2015-12-18 | Disposition: A | Discharge status: Home / Self Care | Payer: Medicaid Other | Attending: Emergency Medicine | Admitting: Emergency Medicine

## 2015-12-18 ENCOUNTER — Encounter (HOSPITAL_COMMUNITY): Payer: Self-pay | Admitting: Emergency Medicine

## 2015-12-18 ENCOUNTER — Emergency Department (HOSPITAL_COMMUNITY): Payer: Medicaid Other

## 2015-12-18 DIAGNOSIS — F17210 Nicotine dependence, cigarettes, uncomplicated: Secondary | ICD-10-CM | POA: Diagnosis not present

## 2015-12-18 DIAGNOSIS — R350 Frequency of micturition: Secondary | ICD-10-CM | POA: Diagnosis not present

## 2015-12-18 DIAGNOSIS — S40021A Contusion of right upper arm, initial encounter: Secondary | ICD-10-CM | POA: Insufficient documentation

## 2015-12-18 DIAGNOSIS — Y9301 Activity, walking, marching and hiking: Secondary | ICD-10-CM | POA: Diagnosis not present

## 2015-12-18 DIAGNOSIS — J45909 Unspecified asthma, uncomplicated: Secondary | ICD-10-CM | POA: Insufficient documentation

## 2015-12-18 DIAGNOSIS — Y999 Unspecified external cause status: Secondary | ICD-10-CM | POA: Insufficient documentation

## 2015-12-18 DIAGNOSIS — Y929 Unspecified place or not applicable: Secondary | ICD-10-CM | POA: Insufficient documentation

## 2015-12-18 DIAGNOSIS — R110 Nausea: Secondary | ICD-10-CM | POA: Insufficient documentation

## 2015-12-18 DIAGNOSIS — M79601 Pain in right arm: Secondary | ICD-10-CM | POA: Diagnosis present

## 2015-12-18 DIAGNOSIS — W109XXA Fall (on) (from) unspecified stairs and steps, initial encounter: Secondary | ICD-10-CM | POA: Insufficient documentation

## 2015-12-18 DIAGNOSIS — F329 Major depressive disorder, single episode, unspecified: Secondary | ICD-10-CM | POA: Diagnosis not present

## 2015-12-18 DIAGNOSIS — Z79899 Other long term (current) drug therapy: Secondary | ICD-10-CM | POA: Diagnosis not present

## 2015-12-18 LAB — URINALYSIS, ROUTINE W REFLEX MICROSCOPIC
Bilirubin Urine: NEGATIVE
Glucose, UA: NEGATIVE mg/dL
KETONES UR: NEGATIVE mg/dL
NITRITE: NEGATIVE
PH: 6 (ref 5.0–8.0)
Protein, ur: NEGATIVE mg/dL
SPECIFIC GRAVITY, URINE: 1.02 (ref 1.005–1.030)

## 2015-12-18 LAB — URINE MICROSCOPIC-ADD ON

## 2015-12-18 LAB — PREGNANCY, URINE: Preg Test, Ur: NEGATIVE

## 2015-12-18 MED ORDER — NAPROXEN 250 MG PO TABS
500.0000 mg | ORAL_TABLET | Freq: Once | ORAL | Status: AC
  Administered 2015-12-18: 500 mg via ORAL
  Filled 2015-12-18: qty 2

## 2015-12-18 MED ORDER — NAPROXEN 500 MG PO TABS
500.0000 mg | ORAL_TABLET | Freq: Two times a day (BID) | ORAL | Status: DC | PRN
Start: 2015-12-18 — End: 2018-07-24

## 2015-12-18 MED ORDER — ACETAMINOPHEN 500 MG PO TABS
1000.0000 mg | ORAL_TABLET | Freq: Once | ORAL | Status: AC
  Administered 2015-12-18: 1000 mg via ORAL
  Filled 2015-12-18: qty 2

## 2015-12-18 MED ORDER — CEPHALEXIN 500 MG PO CAPS: 500.0000 mg | ORAL_CAPSULE | Freq: Two times a day (BID) | ORAL | Status: DC

## 2015-12-18 NOTE — ED Notes (Signed)
Pt ambulating independently w/ steady gait on d/c in no acute distress, A&Ox4. D/c instructions reviewed w/ pt and family - pt and family deny any further questions or concerns at present. Rx given x2  

## 2015-12-18 NOTE — ED Notes (Signed)
Patient states she fell yesterday injuring her right wrist. Bruising noted to right wrist at triage. Also complaining of lower back pain with urinary burning and frequency x 1 week.

## 2015-12-18 NOTE — ED Notes (Signed)
Pt presents w/ rt wrist pain s/p fall while on stairs yesterday, no gross deformity noted on assessment, pt c/o pain/tenderness. Pt also admits to urinary frequency, dysuria and moderate flank pain. Admits to hx of kidney stones.

## 2015-12-18 NOTE — ED Notes (Signed)
Pt returned from xray

## 2015-12-18 NOTE — ED Notes (Signed)
Patient transported to X-ray 

## 2015-12-18 NOTE — ED Provider Notes (Signed)
CSN: [id number]     Arrival date & time 12/18/15  [id number] History  By signing my name below, I, Rosario AdieWilliam Andrew Hiatt, attest that this documentation has been prepared under the direction and in the presence of Blane OharaJoshua Jaykwon Morones, MD. Electronically Signed: Rosario AdieWilliam Andrew Hiatt, ED Scribe. 12/18/2015. 9:01 AM.   Chief Complaint  Patient presents with  . Arm Injury  . Dysuria   The history is provided by the patient. No language interpreter was used.   HPI Comments: Pamela Sanford is a 35 y.o. female with a PMHx of GERD, depression, and asthma who presents to the Emergency Department complaining of sudden onset, gradually worsening, constant right arm pain s/p falling down a set of steps onto concrete 1 day PTA. No other known injuries. She reports that when she was walking down a set of steps when she "missed a few", causing her to fall forward onto her right arm. She reports having moderate ecchymosis to the area of pain, and notes that it is painful to palpation. No alleviating factors noted.   She is also complaining of gradual onset, gradually worsening, constant middle-lower back pain that radiates around and forward x 1 week. She states that her pain initially was intermittent but has progressed to being constant. Pt reports that she has a history of nephrolithiasis, and states that her pain today is similar to her hx of pain with the episodes. She notes that her last episode of similar pain due to nephrolithiasis was approximately 1.5 months PTA. Pt has had associated episodes of urinary frequency and nausea, but denies hematuria or dysuria. Her pain is worsened with bending and positional changes. No back injury, trauma, or recent heavy lifting. She denies abdominal pain, chills, fever, or vomiting. No alleviating factors noted.   Past Medical History  Diagnosis Date  . Foot fracture   . Depression   . GERD (gastroesophageal reflux disease)   . Asthma    Past Surgical History  Procedure  Laterality Date  . Wisdom tooth extraction    . Multiple tooth extractions    . Laparoscopic tubal ligation     History reviewed. No pertinent family history. Social History  Substance Use Topics  . Smoking status: Current Every Day Smoker -- 0.04 packs/day for 8 years    Types: Cigarettes  . Smokeless tobacco: Never Used  . Alcohol Use: No   OB History    Gravida Para Term Preterm AB TAB SAB Ectopic Multiple Living   2 2 2       2      Review of Systems  Constitutional: Negative for fever and chills.  Gastrointestinal: Positive for nausea. Negative for vomiting.  Genitourinary: Positive for frequency. Negative for dysuria and hematuria.  Musculoskeletal: Positive for myalgias and back pain (lower).  All other systems reviewed and are negative.  Allergies  Ibuprofen and Tramadol  Home Medications   Prior to Admission medications   Medication Sig Start Date End Date Taking? Authorizing Provider  acetaminophen-codeine (TYLENOL #3) 300-30 MG tablet Take 1-2 tablets by mouth every 6 (six) hours as needed. Patient taking differently: Take 1-2 tablets by mouth every 6 (six) hours as needed for moderate pain.  11/01/15  Yes Ivery QualeHobson Bryant, PA-C  albuterol (PROVENTIL) (5 MG/ML) 0.5% nebulizer solution Take 2.5 mg by nebulization every 4 (four) hours as needed for wheezing or shortness of breath.   Yes Historical Provider, MD  omeprazole (PRILOSEC OTC) 20 MG tablet Take 20 mg by mouth daily.   Yes  Historical Provider, MD  amoxicillin (AMOXIL) 500 MG capsule Take 1 capsule (500 mg total) by mouth 3 (three) times daily. Patient not taking: Reported on 12/18/2015 11/01/15   Ivery Quale, PA-C  HYDROcodone-acetaminophen (NORCO/VICODIN) 5-325 MG tablet Take 1 tablet by mouth every 4 (four) hours as needed. Patient not taking: Reported on 12/18/2015 09/21/15   Elson Areas, PA-C  methocarbamol (ROBAXIN) 500 MG tablet Take 1 tablet (500 mg total) by mouth 2 (two) times daily. Patient not taking:  Reported on 12/18/2015 09/21/15   Elson Areas, PA-C  naproxen (NAPROSYN) 500 MG tablet Take 1 tablet (500 mg total) by mouth 2 (two) times daily as needed. 12/18/15   Blane Ohara, MD  ondansetron (ZOFRAN) 4 MG tablet Take 1 tablet (4 mg total) by mouth every 6 (six) hours. Patient not taking: Reported on 09/21/2015 08/29/15   Ivery Quale, PA-C   BP 126/76 mmHg  Pulse 96  Temp(Src) 98.3 F (36.8 C) (Oral)  Resp 16  Ht 5\' 3"  (1.6 m)  Wt 162 lb (73.483 kg)  BMI 28.70 kg/m2  SpO2 98%  LMP 11/20/2015   Physical Exam  Constitutional: She is oriented to person, place, and time. She appears well-developed and well-nourished.  HENT:  Head: Normocephalic.  Eyes: Conjunctivae are normal.  Neck: Normal range of motion.  Cardiovascular: Normal rate.   Pulses:      Radial pulses are 2+ on the right side.  Ulnar pulses are 2+ on the right side.  Pulmonary/Chest: Effort normal. No respiratory distress.  Abdominal: Soft. She exhibits no distension. There is no tenderness.  No focal abdominal tenderness.   Musculoskeletal: She exhibits tenderness.  Right arm: No significant elbow tenderness or joint effusion. She has TTP to her distal ulnar/distal wrist. TTP to the dorsum of the wrist on the right. Ecchymosis noted on the distal portion of the right arm.  Back: Pt has paraspinal tenderness in the lumbar region.   Neurological: She is alert and oriented to person, place, and time.  Reflex Scores:      Patellar reflexes are 2+ on the right side and 2+ on the left side.      Achilles reflexes are 2+ on the right side and 2+ on the left side. 5+ strength of extension and flexion of the knees. 5+ strength of hip flexion and extensions. 5+ strength great toe dorsiflexion and plantar flexion. Sensation to palpation to major nerves is intact to her bilateral lower extremities.   Skin: Skin is warm and dry.  Psychiatric: She has a normal mood and affect. Her behavior is normal.  Nursing note and  vitals reviewed.  ED Course  Procedures (including critical care time)  DIAGNOSTIC STUDIES: Oxygen Saturation is 98% on RA, normal by my interpretation.   COORDINATION OF CARE: 8:55 AM-Discussed next steps with pt including [place] Nephrolithiasis study, DG right forearm. Pt verbalized understanding and is agreeable with the plan.   Labs Review Labs Reviewed  URINALYSIS, ROUTINE W REFLEX MICROSCOPIC (NOT AT [facility]) - Abnormal; Notable for the following:    Hgb urine dipstick TRACE (*)    Leukocytes, UA LARGE (*)    All other components within normal limits  URINE MICROSCOPIC-ADD ON - Abnormal; Notable for the following:    Squamous Epithelial / LPF TOO NUMEROUS TO COUNT (*)    Bacteria, UA FEW (*)    All other components within normal limits  URINE CULTURE  PREGNANCY, URINE    Imaging Review Dg Forearm Right  12/18/2015  CLINICAL DATA:  35 year old female with right forearm pain after falling yesterday. Pain and bruising and swelling localizes to the ulnar aspect of the wrist. EXAM: RIGHT FOREARM - 2 VIEW COMPARISON:  Radiographs of the right wrist obtained 12/16/2015 FINDINGS: No evidence of acute fracture or malalignment. Normal bony mineralization. No lytic or blastic osseous lesions. Reticulation of the subcutaneous fat along the ulnar and dorsal aspect of the distal forearm and wrist consistent with soft tissue contusion. The visualized carpus and proximal metacarpals appear intact. IMPRESSION: No evidence of fracture or malalignment. Soft tissue contusion along the ulnar and dorsal aspect of the distal forearm and wrist. Electronically Signed   By: Malachy Moan M.D.   On: 12/18/2015 09:29   I have personally reviewed and evaluated these images and lab results as part of my medical decision-making.  MDM   Final diagnoses:  Arm contusion, right, initial encounter  Urinary frequency   X-ray no acute fracture. Urinalysis difficult to interpret due to multiple squamous cells.  Recommended following urine culture for final decision on antibiotic duration.  Results and differential diagnosis were discussed with the patient/parent/guardian. Xrays were independently reviewed by myself.  Close follow up outpatient was discussed, comfortable with the plan.   Medications  naproxen (NAPROSYN) tablet 500 mg (not administered)  acetaminophen (TYLENOL) tablet 1,000 mg (1,000 mg Oral Given 12/18/15 0906)    Filed Vitals:   12/18/15 0845  BP: 126/76  Pulse: 96  Temp: 98.3 F (36.8 C)  TempSrc: Oral  Resp: 16  Height:  (1.6 m)  Weight: 162 lb (73.483 kg)  SpO2: 98%    Final diagnoses:  Arm contusion, right, initial encounter  Urinary frequency      Blane Ohara, MD 12/18/15 1020

## 2015-12-18 NOTE — Discharge Instructions (Signed)
Use ice and take pain medicines as needed for injury. Follow-up your urine culture results in 2 days with primary doctor. Return to the ER for persistent fevers, persistent vomiting, uncontrolled pain, weakness or numbness in your legs or other concerns.  If you were given medicines take as directed.  If you are on coumadin or contraceptives realize their levels and effectiveness is altered by many different medicines.  If you have any reaction (rash, tongues swelling, other) to the medicines stop taking and see a physician.    If your blood pressure was elevated in the ER make sure you follow up for management with a primary doctor or return for chest pain, shortness of breath or stroke symptoms.  Please follow up as directed and return to the ER or see a physician for new or worsening symptoms.  Thank you. Filed Vitals:   12/18/15 0845  BP: 126/76  Pulse: 96  Temp: 98.3 F (36.8 C)  TempSrc: Oral  Resp: 16  Height: 5\' 3"  (1.6 m)  Weight: 162 lb (73.483 kg)  SpO2: 98%

## 2015-12-19 LAB — URINE CULTURE

## 2018-07-20 ENCOUNTER — Encounter (HOSPITAL_COMMUNITY): Payer: Self-pay | Admitting: Emergency Medicine

## 2018-07-20 ENCOUNTER — Other Ambulatory Visit: Payer: Self-pay

## 2018-07-20 ENCOUNTER — Emergency Department (HOSPITAL_COMMUNITY)
Admission: EM | Admit: 2018-07-20 | Discharge: 2018-07-21 | Payer: Self-pay | Attending: Emergency Medicine | Admitting: Emergency Medicine

## 2018-07-20 DIAGNOSIS — F1410 Cocaine abuse, uncomplicated: Secondary | ICD-10-CM | POA: Insufficient documentation

## 2018-07-20 DIAGNOSIS — F329 Major depressive disorder, single episode, unspecified: Secondary | ICD-10-CM

## 2018-07-20 DIAGNOSIS — J45909 Unspecified asthma, uncomplicated: Secondary | ICD-10-CM | POA: Insufficient documentation

## 2018-07-20 DIAGNOSIS — R45851 Suicidal ideations: Secondary | ICD-10-CM | POA: Insufficient documentation

## 2018-07-20 DIAGNOSIS — N390 Urinary tract infection, site not specified: Secondary | ICD-10-CM | POA: Insufficient documentation

## 2018-07-20 DIAGNOSIS — F32A Depression, unspecified: Secondary | ICD-10-CM

## 2018-07-20 DIAGNOSIS — F333 Major depressive disorder, recurrent, severe with psychotic symptoms: Secondary | ICD-10-CM | POA: Insufficient documentation

## 2018-07-20 LAB — COMPREHENSIVE METABOLIC PANEL
ALK PHOS: 55 U/L (ref 38–126)
ALT: 13 U/L (ref 0–44)
ANION GAP: 7 (ref 5–15)
AST: 13 U/L — ABNORMAL LOW (ref 15–41)
Albumin: 4 g/dL (ref 3.5–5.0)
BILIRUBIN TOTAL: 0.4 mg/dL (ref 0.3–1.2)
BUN: 9 mg/dL (ref 6–20)
CALCIUM: 8.9 mg/dL (ref 8.9–10.3)
CO2: 24 mmol/L (ref 22–32)
CREATININE: 0.88 mg/dL (ref 0.44–1.00)
Chloride: 109 mmol/L (ref 98–111)
GFR calc non Af Amer: >60 mL/min (ref >60)
Glucose, Bld: 99 mg/dL (ref 70–99)
Potassium: 4.1 mmol/L (ref 3.5–5.1)
Sodium: 140 mmol/L (ref 135–145)
Total Protein: 6.7 g/dL (ref 6.5–8.1)

## 2018-07-20 LAB — RAPID URINE DRUG SCREEN, HOSP PERFORMED
AMPHETAMINES: NOT DETECTED
Barbiturates: NOT DETECTED
Benzodiazepines: NOT DETECTED
Cocaine: POSITIVE — AB
OPIATES: NOT DETECTED
TETRAHYDROCANNABINOL: NOT DETECTED

## 2018-07-20 LAB — CBC
HCT: 41.7 % (ref 36.0–46.0)
HEMOGLOBIN: 13 g/dL (ref 12.0–15.0)
MCH: 26.7 pg (ref 26.0–34.0)
MCHC: 31.2 g/dL (ref 30.0–36.0)
MCV: 85.8 fL (ref 80.0–100.0)
PLATELETS: 228 10*3/uL (ref 150–400)
RBC: 4.86 MIL/uL (ref 3.87–5.11)
RDW: 13.7 % (ref 11.5–15.5)
WBC: 10.1 10*3/uL (ref 4.0–10.5)
nRBC: 0 % (ref 0.0–0.2)

## 2018-07-20 LAB — SALICYLATE LEVEL

## 2018-07-20 LAB — ACETAMINOPHEN LEVEL

## 2018-07-20 LAB — ETHANOL: Alcohol, Ethyl (B): <10 mg/dL (ref <10)

## 2018-07-20 NOTE — ED Provider Notes (Signed)
[facility] EMERGENCY DEPARTMENT Provider Note   CSN: [id number] Arrival date & time: 07/20/18  0532     History   Chief Complaint Chief Complaint  Patient presents with  . V70.1    HPI Pamela Sanford is a 38 y.o. female.  Level 5 caveat for psychiatric illness.  Patient presents under involuntary commitment today.  Apparently she texted her friend about issues of depression and suicidal ideation.  The friend currently has custody of her 3 children.  No previous psychiatric admissions.  Patient does not think she needs to be here.     Past Medical History:  Diagnosis Date  . Asthma   . Depression   . Foot fracture   . GERD (gastroesophageal reflux disease)     There are no active problems to display for this patient.   Past Surgical History:  Procedure Laterality Date  . LAPAROSCOPIC TUBAL LIGATION    . MULTIPLE TOOTH EXTRACTIONS    . WISDOM TOOTH EXTRACTION       OB History    Gravida  2   Para  2   Term  2   Preterm      AB      Living  2     SAB      TAB      Ectopic      Multiple      Live Births               Home Medications    Prior to Admission medications   Medication Sig Start Date End Date Taking? Authorizing Provider  acetaminophen-codeine (TYLENOL #3) 300-30 MG tablet Take 1-2 tablets by mouth every 6 (six) hours as needed. Patient taking differently: Take 1-2 tablets by mouth every 6 (six) hours as needed for moderate pain.  11/01/15   Ivery Quale, PA-C  albuterol (PROVENTIL) (5 MG/ML) 0.5% nebulizer solution Take 2.5 mg by nebulization every 4 (four) hours as needed for wheezing or shortness of breath.    [provider]  amoxicillin (AMOXIL) 500 MG capsule Take 1 capsule (500 mg total) by mouth 3 (three) times daily. Patient not taking: Reported on 12/18/2015 11/01/15   Ivery Quale, PA-C  cephALEXin (KEFLEX) 500 MG capsule Take 1 capsule (500 mg total) by mouth 2 (two) times daily. 12/18/15   Blane Ohara, MD  HYDROcodone-acetaminophen (NORCO/VICODIN) 5-325 MG tablet Take 1 tablet by mouth every 4 (four) hours as needed. Patient not taking: Reported on 12/18/2015 09/21/15   Elson Areas, PA-C  methocarbamol (ROBAXIN) 500 MG tablet Take 1 tablet (500 mg total) by mouth 2 (two) times daily. Patient not taking: Reported on 12/18/2015 09/21/15   Elson Areas, PA-C  naproxen (NAPROSYN) 500 MG tablet Take 1 tablet (500 mg total) by mouth 2 (two) times daily as needed. 12/18/15   Blane Ohara, MD  omeprazole (PRILOSEC OTC) 20 MG tablet Take 20 mg by mouth daily.    [provider]  ondansetron (ZOFRAN) 4 MG tablet Take 1 tablet (4 mg total) by mouth every 6 (six) hours. Patient not taking: Reported on 09/21/2015 08/29/15   Ivery Quale, PA-C    Family History No family history on file.  Social History Social History   Tobacco Use  . Smoking status: Current Every Day Smoker    Packs/day: 0.04    Years: 8.00    Pack years: 0.32    Types: Cigarettes  . Smokeless tobacco: Never Used  Substance Use Topics  . Alcohol  use: No  . Drug use: No     Allergies   Ibuprofen and Tramadol   Review of Systems Review of Systems  Unable to perform ROS: Psychiatric disorder     Physical Exam Updated Vital Signs BP 108/72 (BP Location: Right Arm)   Pulse 79   Temp 97.7 F (36.5 C) (Oral)   Resp 16   Ht 5\' 2"  (1.575 m)   Wt 73.9 kg   LMP 06/24/2018   SpO2 100%   BMI 29.81 kg/m   Physical Exam Vitals signs and nursing note reviewed.  Constitutional:      Appearance: She is well-developed.     Comments: nad  HENT:     Head: Normocephalic and atraumatic.  Eyes:     Conjunctiva/sclera: Conjunctivae normal.  Neck:     Musculoskeletal: Neck supple.  Cardiovascular:     Rate and Rhythm: Normal rate and regular rhythm.  Pulmonary:     Effort: Pulmonary effort is normal.     Breath sounds: Normal breath sounds.  Abdominal:     General: Bowel sounds are normal.      Palpations: Abdomen is soft.  Musculoskeletal: Normal range of motion.  Skin:    General: Skin is warm and dry.  Neurological:     Mental Status: She is alert and oriented to person, place, and time.  Psychiatric:     Comments: Flat affect, depressed      ED Treatments / Results  Labs (all labs ordered are listed, but only abnormal results are displayed) Labs Reviewed  COMPREHENSIVE METABOLIC PANEL - Abnormal; Notable for the following components:      Result Value   AST 13 (*)    All other components within normal limits  ACETAMINOPHEN LEVEL - Abnormal; Notable for the following components:   Acetaminophen (Tylenol), Serum <10 (*)    All other components within normal limits  RAPID URINE DRUG SCREEN, HOSP PERFORMED - Abnormal; Notable for the following components:   Cocaine POSITIVE (*)    All other components within normal limits  ETHANOL  SALICYLATE LEVEL  CBC    EKG None  Radiology No results found.  Procedures Procedures (including critical care time)  Medications Ordered in ED Medications - No data to display   Initial Impression / Assessment and Plan / ED Course  I have reviewed the triage vital signs and the nursing notes.  Pertinent labs & imaging results that were available during my care of the patient were reviewed by me and considered in my medical decision making (see chart for details).     Patient presents with suicidal ideation via texting to a friend.  She is hemodynamically stable and not psychotic.  Probable admission.  Final Clinical Impressions(s) / ED Diagnoses   Final diagnoses:  Depression, unspecified depression type  Suicidal ideation    ED Discharge Orders    None       Donnetta Hutching, MD 07/20/18 0900

## 2018-07-20 NOTE — ED Notes (Signed)
Pt refused breakfast tray. Pt given something to drink.

## 2018-07-20 NOTE — BH Assessment (Signed)
Clinician left a HIPAA-compliant message for [employer], the petitioner of pt's IVC. Clinician provided Ms. Toler the phone number to return this call and requested she return this call at her earliest convenience.  Claddy Toler: [phone]

## 2018-07-20 NOTE — Progress Notes (Addendum)
Pt meets inpatient criteria per Nira Conn, NP. Referral information has been sent to the following hospitals for review:  [facility]  [facility]  [facility]  [facility]  [facility]  [facility]  [facility]  [facility]   Disposition will continue to assist with inpatient placement needs.   Wells Guiles, LCSW, LCAS Disposition CSW [facility] [phone]

## 2018-07-20 NOTE — ED Notes (Signed)
Have given pt a meal tray  
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
## 2018-07-20 NOTE — ED Provider Notes (Signed)
TTS recommends inpatient admission   Devoria Albe, MD 07/20/18 [phone]
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
## 2018-07-20 NOTE — ED Notes (Signed)
Sitter is with pt 

## 2018-07-20 NOTE — ED Triage Notes (Signed)
Pt brought in by [place] [employer] under IVC after an acquaintance (who has temporary custody of pt's children) called stating that she felt like pt was a threat to herself based on texts she had received.

## 2018-07-20 NOTE — ED Notes (Signed)
Pt given lunch tray.

## 2018-07-21 ENCOUNTER — Inpatient Hospital Stay
Admission: AD | Admit: 2018-07-21 | Discharge: 2018-07-24 | DRG: 882 | Disposition: A | Discharge status: Home / Self Care | Payer: No Typology Code available for payment source | Source: Intra-hospital | Attending: Psychiatry | Admitting: Psychiatry

## 2018-07-21 ENCOUNTER — Inpatient Hospital Stay (HOSPITAL_COMMUNITY)
Admission: AD | Admit: 2018-07-21 | Payer: Federal, State, Local not specified - Other | Source: Intra-hospital | Admitting: Psychiatry

## 2018-07-21 DIAGNOSIS — K219 Gastro-esophageal reflux disease without esophagitis: Secondary | ICD-10-CM | POA: Diagnosis present

## 2018-07-21 DIAGNOSIS — F1410 Cocaine abuse, uncomplicated: Secondary | ICD-10-CM | POA: Diagnosis present

## 2018-07-21 DIAGNOSIS — J45909 Unspecified asthma, uncomplicated: Secondary | ICD-10-CM | POA: Diagnosis present

## 2018-07-21 DIAGNOSIS — N390 Urinary tract infection, site not specified: Secondary | ICD-10-CM | POA: Diagnosis present

## 2018-07-21 DIAGNOSIS — Z818 Family history of other mental and behavioral disorders: Secondary | ICD-10-CM

## 2018-07-21 DIAGNOSIS — Z915 Personal history of self-harm: Secondary | ICD-10-CM | POA: Diagnosis not present

## 2018-07-21 DIAGNOSIS — F332 Major depressive disorder, recurrent severe without psychotic features: Secondary | ICD-10-CM | POA: Diagnosis present

## 2018-07-21 DIAGNOSIS — F17210 Nicotine dependence, cigarettes, uncomplicated: Secondary | ICD-10-CM | POA: Diagnosis present

## 2018-07-21 DIAGNOSIS — F4325 Adjustment disorder with mixed disturbance of emotions and conduct: Secondary | ICD-10-CM | POA: Diagnosis present

## 2018-07-21 DIAGNOSIS — Z79899 Other long term (current) drug therapy: Secondary | ICD-10-CM | POA: Diagnosis not present

## 2018-07-21 LAB — LIPID PANEL
Cholesterol: 162 mg/dL (ref 0–200)
HDL: 33 mg/dL — ABNORMAL LOW (ref >40)
LDL Cholesterol: 84 mg/dL (ref 0–99)
Total CHOL/HDL Ratio: 4.9 RATIO
Triglycerides: 224 mg/dL — ABNORMAL HIGH (ref <150)
VLDL: 45 mg/dL — ABNORMAL HIGH (ref 0–40)

## 2018-07-21 LAB — URINALYSIS, ROUTINE W REFLEX MICROSCOPIC
Bilirubin Urine: NEGATIVE
Glucose, UA: NEGATIVE mg/dL
Hgb urine dipstick: NEGATIVE
KETONES UR: NEGATIVE mg/dL
Nitrite: NEGATIVE
Protein, ur: NEGATIVE mg/dL
Specific Gravity, Urine: 1.023 (ref 1.005–1.030)
Squamous Epithelial / HPF: >50 — ABNORMAL HIGH (ref 0–5)
pH: 8 (ref 5.0–8.0)

## 2018-07-21 LAB — TSH: TSH: 0.722 u[IU]/mL (ref 0.350–4.500)

## 2018-07-21 MED ORDER — ALUM & MAG HYDROXIDE-SIMETH 200-200-20 MG/5ML PO SUSP: 30.0000 mL | ORAL | Status: DC | PRN

## 2018-07-21 MED ORDER — ACETAMINOPHEN 325 MG PO TABS
650.0000 mg | ORAL_TABLET | Freq: Four times a day (QID) | ORAL | Status: DC | PRN
  Administered 2018-07-22 – 2018-07-23 (×2): 650 mg via ORAL
  Filled 2018-07-21 (×2): qty 2

## 2018-07-21 MED ORDER — TRAZODONE HCL 50 MG PO TABS
50.0000 mg | ORAL_TABLET | Freq: Every evening | ORAL | Status: DC | PRN
  Administered 2018-07-21 – 2018-07-23 (×3): 50 mg via ORAL
  Filled 2018-07-21 (×3): qty 1

## 2018-07-21 MED ORDER — MAGNESIUM HYDROXIDE 400 MG/5ML PO SUSP: 30.0000 mL | Freq: Every day | ORAL | Status: DC | PRN

## 2018-07-21 MED ORDER — NITROFURANTOIN MONOHYD MACRO 100 MG PO CAPS
100.0000 mg | ORAL_CAPSULE | Freq: Two times a day (BID) | ORAL | Status: DC
  Administered 2018-07-21: 100 mg via ORAL
  Filled 2018-07-21: qty 1

## 2018-07-21 MED ORDER — ACETAMINOPHEN 325 MG PO TABS
650.0000 mg | ORAL_TABLET | Freq: Four times a day (QID) | ORAL | Status: DC | PRN
  Administered 2018-07-21: 650 mg via ORAL
  Filled 2018-07-21: qty 2

## 2018-07-21 MED ORDER — NITROFURANTOIN MONOHYD MACRO 100 MG PO CAPS
100.0000 mg | ORAL_CAPSULE | Freq: Two times a day (BID) | ORAL | Status: DC
  Administered 2018-07-21 – 2018-07-24 (×6): 100 mg via ORAL
  Filled 2018-07-21 (×8): qty 1

## 2018-07-21 MED ORDER — ALBUTEROL SULFATE HFA 108 (90 BASE) MCG/ACT IN AERS
2.0000 | INHALATION_SPRAY | Freq: Four times a day (QID) | RESPIRATORY_TRACT | Status: DC | PRN
  Filled 2018-07-21: qty 6.7

## 2018-07-21 MED ORDER — HYDROXYZINE HCL 25 MG PO TABS
25.0000 mg | ORAL_TABLET | Freq: Three times a day (TID) | ORAL | Status: DC | PRN
  Administered 2018-07-21 – 2018-07-23 (×3): 25 mg via ORAL
  Filled 2018-07-21 (×3): qty 1

## 2018-07-21 NOTE — ED Notes (Signed)
Pt given breakfast tray

## 2018-07-21 NOTE — ED Provider Notes (Signed)
Pt has been accepted to [facility] by Dr. Viviano Simas.  Pt reexamined prior to transfer and remains stable for transfer.    Jacalyn Lefevre, MD 07/21/18 1921

## 2018-07-21 NOTE — ED Notes (Signed)
Pt reports urinary frequency and dysuria. EDP notified.

## 2018-07-21 NOTE — ED Notes (Signed)
Pt escorted to shower by this sitter

## 2018-07-21 NOTE — BH Assessment (Signed)
Patient has been accepted to [facility].  Accepting physician is Dr. Viviano Simas.  Attending Physician will be Dr. Toni Amend.  Patient has been assigned to room 307, by [facility] Charge Nurse [place].  Call report to [phone].  Representative/Transfer Coordinator is Psychologist, prison and probation services [facility] TTS) Patient pre-admitted by [facility] Patient Access Sharmon Leyden)   Staff Maralyn Sago, CSW) made aware of acceptance.

## 2018-07-21 NOTE — Progress Notes (Signed)
Need first opinion prior to acceptance.   [facility] 302-Bed 2.  Call report to [phone]. Dr. Jola Babinski  Can come after 8am.

## 2018-07-21 NOTE — ED Notes (Signed)
Pt belongings and paperwork provided to [facility] prior to transport to [facility]. Pt verified all belongings present prior to transport.

## 2018-07-21 NOTE — ED Provider Notes (Signed)
IVC First Exam completed and filed.   Dione Booze, MD 07/21/18 [phone]

## 2018-07-21 NOTE — ED Provider Notes (Signed)
Urinalysis reveals evidence of infection.  Will culture urine and start Macrobid twice a day for 1 week.   Donnetta Hutching, MD 07/21/18 [phone]

## 2018-07-21 NOTE — ED Notes (Signed)
Attempted to call report to [facility]. Staff member stated pt was not to arrived until after 1930. Consulting civil engineer and patient notified.

## 2018-07-22 ENCOUNTER — Other Ambulatory Visit: Payer: Self-pay

## 2018-07-22 DIAGNOSIS — F4325 Adjustment disorder with mixed disturbance of emotions and conduct: Principal | ICD-10-CM

## 2018-07-22 DIAGNOSIS — F1410 Cocaine abuse, uncomplicated: Secondary | ICD-10-CM

## 2018-07-22 LAB — LIPID PANEL
Cholesterol: 143 mg/dL (ref 0–200)
HDL: 30 mg/dL — ABNORMAL LOW (ref >40)
LDL Cholesterol: 87 mg/dL (ref 0–99)
Total CHOL/HDL Ratio: 4.8 RATIO
Triglycerides: 131 mg/dL (ref <150)
VLDL: 26 mg/dL (ref 0–40)

## 2018-07-22 MED ORDER — SERTRALINE HCL 25 MG PO TABS
50.0000 mg | ORAL_TABLET | Freq: Every day | ORAL | Status: DC
  Administered 2018-07-22 – 2018-07-24 (×3): 50 mg via ORAL
  Filled 2018-07-22 (×3): qty 2

## 2018-07-22 NOTE — Plan of Care (Signed)
Data: Patient is appropriate and cooperative to assessment. Patient denies SI/HI/AVH. Patient has completed daily self inventory worksheet. Patient reports mild anxiety. Patient has a pain rating of 0/10. Patient reports good sleep quality, appetite is fair for the last 24 hours. Patient rates depression "0/10" , feelings of hopelessness "0/10" and anxiety "1/10" Patients goal for today is left blank.  Action:  Q x 15 minute observation checks were completed for safety. Patient was provided with education on medications. Patient was offered support and encouragement. Patient was given scheduled medications. Patient  was encourage to attend groups, participate in unit activities and continue with plan of care.    Response: Patient is adherent with scheduled medication. Patient has no complaints at this time. Patient is receptive to treatment and safety maintained on unit.    Problem: Education: Goal: Knowledge of [place] [facility] Education information/materials will improve Outcome: Not Progressing Goal: Mental status will improve Outcome: Not Progressing Goal: Verbalization of understanding the information provided will improve Outcome: Not Progressing

## 2018-07-22 NOTE — Progress Notes (Signed)
D - Patient was admitted at 1030 pm from [facility]. Patient was pleasant during assessment. Skin check was done with Heather, MHT, no abnormalities found. No contraband found during assessment. Patient denies SI/HI/AVH, pain and depression. Patient stated, "I am feeling anxiety but I think it's just from being here. I would rate it a 4/10." When assessed about why she is here the patient stated, "I said the wrong thing to the person who takes care of my kids and they had me committed. I don't want to hurt myself, I have a custody hearing coming up next week and I want my kids back. I am been going to rehab and I don't want to mess that up by being in here."   A - Patient was compliant with medication administration per MD orders and procedures on the unit. Patient was oriented to her room and around the unit. Patient given education. Patient given support and encouragement to be active in her treatment plan. Patient informed to let staff know if there are any issues or problems on the unit.   R - Patient being monitored Q 15 minutes for safety per unit protocol. Patient remains safe on the unit.

## 2018-07-22 NOTE — Progress Notes (Signed)
Recreation Therapy Notes  Date: 07/22/2018  Time: 9:30 am  Location: Craft Room  Behavioral response: Appropriate  Intervention Topic: Happiness  Discussion/Intervention:  Group content today was focused on Happiness. The group defined happiness and described where happiness comes from. Individuals identified what makes them happy and how they go about making others happy. Patients expressed things that stop them from being happy and ways they can improve their happiness. The group stated reasons why it is important to be happy. The group participated in the intervention "My Happiness", where they had a chance to identify and express things that make them happy. Clinical Observations/Feedback:  Patient came to group and was focused on what peers and staff had to say about happiness. She was pulled from group by her Child psychotherapist but later returned. Individual was social with peers and staff while participating in the intervention.  Johnatha Zeidman LRT/CTRS          Oluwanifemi Petitti 07/22/2018 1:26 PM
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
## 2018-07-22 NOTE — BHH Counselor (Signed)
CSW attempted to call the patient's [facility] with the [facility], however was unable to speak with her and left a voicemail message requesting a return call.  Voicemail was HIPAA compliant.   Penni Homans, MSW, LCSW 07/22/2018 4:02 PM

## 2018-07-22 NOTE — BHH Group Notes (Signed)
BHH Group Notes:  (Nursing/MHT/Case Management/Adjunct)  Date:  07/22/2018  Time:  3:04 PM  Type of Therapy:  Psychoeducational Skills  Participation Level:  Active  Participation Quality:  Appropriate, Attentive and Sharing  Affect:  Appropriate and Excited  Cognitive:  Alert and Appropriate  Insight:  Appropriate, Good and Improving  Engagement in Group:  Developing/Improving, Engaged and Improving  Modes of Intervention:  Education  Summary of Progress/Problems:  Pamela Sanford 07/22/2018, 3:04 PM

## 2018-07-22 NOTE — BHH Group Notes (Signed)
LCSW Group Therapy Note  07/22/2018 11:58 AM  Type of Therapy/Topic:  Group Therapy:  Emotion Regulation  Participation Level:  Minimal   Description of Group:   The purpose of this group is to assist patients in learning to regulate negative emotions and experience positive emotions. Patients will be guided to discuss ways in which they have been vulnerable to their negative emotions. These vulnerabilities will be juxtaposed with experiences of positive emotions or situations, and patients will be challenged to use positive emotions to combat negative ones. Special emphasis will be placed on coping with negative emotions in conflict situations, and patients will process healthy conflict resolution skills.  Therapeutic Goals: 1. Patient will identify two positive emotions or experiences to reflect on in order to balance out negative emotions 2. Patient will label two or more emotions that they find the most difficult to experience 3. Patient will demonstrate positive conflict resolution skills through discussion and/or role plays  Summary of Patient Progress: Pt was present in group, but pt did not engage in the group discussion.    Therapeutic Modalities:   Cognitive Behavioral Therapy Feelings Identification Dialectical Behavioral Therapy   Iris Pert, MSW, LCSW Clinical Social Work 07/22/2018 11:58 AM

## 2018-07-22 NOTE — Progress Notes (Signed)
Recreation Therapy Notes  INPATIENT RECREATION THERAPY ASSESSMENT  Patient Details Name: Pamela Sanford MRN: [id number] DOB: 04/06/1981 Today's Date: 07/22/2018       Information Obtained From: Patient  Able to Participate in Assessment/Interview: Yes  Patient Presentation: Responsive  Reason for Admission (Per Patient): Active Symptoms  Patient Stressors:    Coping Skills:   Write, Art, Exercise, Music  Leisure Interests (2+):  Exercise - Walking, Social - Friends, Individual - Reading  Frequency of Recreation/Participation: Monthly  Awareness of Community Resources:  Yes  Community Resources:  [place], [place]  Current Use:    If no, Barriers?:    Expressed Interest in [employer] Information:    [place] of Residence:  [employer]  Patient Main Form of Transportation: Walk  Patient Strengths:  Quiet, easy going  Patient Identified Areas of Improvement:  Learn to voice my feelings and say no   Patient Goal for Hospitalization:  To get comftable with me and not worry about others  Current SI (including self-harm):  No  Current HI:  No  Current AVH: No  Staff Intervention Plan: Group Attendance, Collaborate with Interdisciplinary Treatment Team  Consent to Intern Participation: N/A  Negan Grudzien 07/22/2018, 2:07 PM

## 2018-07-22 NOTE — BHH Suicide Risk Assessment (Signed)
[facility] Admission Suicide Risk Assessment   Nursing information obtained from:  Patient Demographic factors:  Low socioeconomic status Current Mental Status:  NA Loss Factors:  Legal issues Historical Factors:  NA Risk Reduction Factors:  Sense of responsibility to family  Total Time spent with patient: 1 hour Principal Problem: <principal problem not specified> Diagnosis:  Active Problems:   Severe recurrent major depression without psychotic features (HCC)  Subjective Data: Patient admits that she had texted some things and sounded suicidal but denies any suicidal intent or plan.  Has reasonably good insight into her anxiety.  Admits to depressed and anxious mood chronically.  No psychosis.  Had a recent relapse on cocaine otherwise has been staying sober.  Continued Clinical Symptoms:  Alcohol Use Disorder Identification Test Final Score (AUDIT): 0 The "Alcohol Use Disorders Identification Test", Guidelines for Use in Primary Care, Second Edition.  World Science writer [facility]). Score between 0-7:  no or low risk or alcohol related problems. Score between 8-15:  moderate risk of alcohol related problems. Score between 16-19:  high risk of alcohol related problems. Score 20 or above:  warrants further diagnostic evaluation for alcohol dependence and treatment.   CLINICAL FACTORS:   Depression:   Anhedonia   Musculoskeletal: Strength & Muscle Tone: within normal limits Gait & Station: normal Patient leans: N/A  Psychiatric Specialty Exam: Physical Exam  Nursing note and vitals reviewed. Constitutional: She appears well-developed and well-nourished.  HENT:  Head: Normocephalic and atraumatic.  Eyes: Pupils are equal, round, and reactive to light. Conjunctivae are normal.  Neck: Normal range of motion.  Cardiovascular: Regular rhythm and normal heart sounds.  Respiratory: Effort normal. No respiratory distress.  GI: Soft.  Musculoskeletal: Normal range of motion.   Neurological: She is alert.  Skin: Skin is warm and dry.  Psychiatric: Judgment normal. Her affect is blunt. Her speech is delayed. She is slowed. Cognition and memory are normal. She expresses no suicidal ideation.    Review of Systems  Constitutional: Negative.   HENT: Negative.   Eyes: Negative.   Respiratory: Negative.   Cardiovascular: Negative.   Gastrointestinal: Negative.   Musculoskeletal: Negative.   Skin: Negative.   Neurological: Negative.   Psychiatric/Behavioral: Positive for depression. Negative for suicidal ideas.    Blood pressure 108/67, pulse 75, temperature 98.2 F (36.8 C), temperature source Oral, resp. rate 18, height 5\' 2"  (1.575 m), weight 73.5 kg, last menstrual period 06/24/2018, SpO2 100 %.Body mass index is 29.63 kg/m.  General Appearance: Casual  Eye Contact:  Good  Speech:  Clear and Coherent  Volume:  Normal  Mood:  Euthymic  Affect:  Depressed  Thought Process:  Goal Directed  Orientation:  Full (Time, Place, and Person)  Thought Content:  Logical  Suicidal Thoughts:  No  Homicidal Thoughts:  No  Memory:  Immediate;   Fair Recent;   Fair Remote;   Fair  Judgement:  Fair  Insight:  Fair  Psychomotor Activity:  Normal  Concentration:  Concentration: Fair  Recall:  [employer] of Knowledge:  Fair  Language:  Negative  Akathisia:  No  Handed:  Right  AIMS (if indicated):     Assets:  Desire for Improvement  ADL's:  Intact  Cognition:  WNL  Sleep:  Number of Hours: 5      COGNITIVE FEATURES THAT CONTRIBUTE TO RISK:  Polarized thinking    SUICIDE RISK:   Minimal: No identifiable suicidal ideation.  Patients presenting with no risk factors but with morbid ruminations;  may be classified as minimal risk based on the severity of the depressive symptoms  PLAN OF CARE: Patient will be on 15-minute checks.  Restart antidepressant medicine.  Engage in individual and group therapy.  Work on discharge planning  I certify that inpatient  services furnished can reasonably be expected to improve the patient's condition.   Mordecai Rasmussen, MD 07/22/2018, 6:09 PM

## 2018-07-22 NOTE — Tx Team (Signed)
Initial Treatment Plan 07/22/2018 1:51 AM Pamela Sanford MBT:[id number]    PATIENT STRESSORS: Legal issue Marital or family conflict   PATIENT STRENGTHS: Ability for insight Motivation for treatment/growth   PATIENT IDENTIFIED PROBLEMS: Suicidal Ideation  Anxiety                   DISCHARGE CRITERIA:  Improved stabilization in mood, thinking, and/or behavior Motivation to continue treatment in a less acute level of care  PRELIMINARY DISCHARGE PLAN: Outpatient therapy Return to previous living arrangement  PATIENT/FAMILY INVOLVEMENT: This treatment plan has been presented to and reviewed with the patient, Pamela Sanford. The patient has been given the opportunity to ask questions and make suggestions.  Elmyra Ricks, RN 07/22/2018, 1:51 AM

## 2018-07-22 NOTE — Tx Team (Addendum)
Interdisciplinary Treatment and Diagnostic Plan Update  07/22/2018 Time of Session: 2:30 PM Pamela RowanCrystal L Sanford MRN: [id number]  Principal Diagnosis: <principal problem not specified>  Secondary Diagnoses: Active Problems:   Severe recurrent major depression without psychotic features (HCC)   Current Medications:  Current Facility-Administered Medications  Medication Dose Route Frequency Provider Last Rate Last Dose  . acetaminophen (TYLENOL) tablet 650 mg  650 mg Oral Q6H PRN Nira ConnBerry, Jason A, NP   650 mg at 07/22/18 1438  . albuterol (PROVENTIL HFA;VENTOLIN HFA) 108 (90 Base) MCG/ACT inhaler 2 puff  2 puff Inhalation Q6H PRN Mariel CraftMaurer, Sheila M, MD      . alum & mag hydroxide-simeth (MAALOX/MYLANTA) 200-200-20 MG/5ML suspension 30 mL  30 mL Oral Q4H PRN Nira ConnBerry, Jason A, NP      . hydrOXYzine (ATARAX/VISTARIL) tablet 25 mg  25 mg Oral TID PRN Jackelyn PolingBerry, Jason A, NP   25 mg at 07/21/18 2326  . magnesium hydroxide (MILK OF MAGNESIA) suspension 30 mL  30 mL Oral Daily PRN Nira ConnBerry, Jason A, NP      . nitrofurantoin (macrocrystal-monohydrate) (MACROBID) capsule 100 mg  100 mg Oral Q12H Mariel CraftMaurer, Sheila M, MD   100 mg at 07/22/18 0815  . sertraline (ZOLOFT) tablet 50 mg  50 mg Oral Daily Clapacs, Jackquline DenmarkJohn T, MD   50 mg at 07/22/18 1432  . traZODone (DESYREL) tablet 50 mg  50 mg Oral QHS PRN Nira ConnBerry, Jason A, NP   50 mg at 07/21/18 2326   PTA Medications: Medications Prior to Admission  Medication Sig Dispense Refill Last Dose  . acetaminophen-codeine (TYLENOL #3) 300-30 MG tablet Take 1-2 tablets by mouth every 6 (six) hours as needed. (Patient taking differently: Take 1-2 tablets by mouth every 6 (six) hours as needed for moderate pain. ) 12 tablet 0 Past Month at Unknown time  . albuterol (PROVENTIL HFA;VENTOLIN HFA) 108 (90 Base) MCG/ACT inhaler Inhale 2 puffs into the lungs every 6 (six) hours as needed for wheezing or shortness of breath.   Past Week at Unknown time  . albuterol (PROVENTIL) (5 MG/ML) 0.5%  nebulizer solution Take 2.5 mg by nebulization every 4 (four) hours as needed for wheezing or shortness of breath.   Not Taking at Unknown time  . amoxicillin (AMOXIL) 500 MG capsule Take 1 capsule (500 mg total) by mouth 3 (three) times daily. (Patient not taking: Reported on 12/18/2015) 21 capsule 0 Not Taking at Unknown time  . benazepril (LOTENSIN) 5 MG tablet Take by mouth.   Past Month at Unknown time  . cephALEXin (KEFLEX) 500 MG capsule Take 1 capsule (500 mg total) by mouth 2 (two) times daily. 14 capsule 0   . HYDROcodone-acetaminophen (NORCO/VICODIN) 5-325 MG tablet Take 1 tablet by mouth every 4 (four) hours as needed. (Patient not taking: Reported on 12/18/2015) 10 tablet 0 Not Taking at Unknown time  . methocarbamol (ROBAXIN) 500 MG tablet Take 1 tablet (500 mg total) by mouth 2 (two) times daily. (Patient not taking: Reported on 12/18/2015) 20 tablet 0 Not Taking at Unknown time  . naproxen (NAPROSYN) 500 MG tablet Take 1 tablet (500 mg total) by mouth 2 (two) times daily as needed. 12 tablet 0   . omeprazole (PRILOSEC OTC) 20 MG tablet Take 20 mg by mouth daily.   Past Month at Unknown time  . ondansetron (ZOFRAN) 4 MG tablet Take 1 tablet (4 mg total) by mouth every 6 (six) hours. (Patient not taking: Reported on 09/21/2015) 12 tablet 0 Not Taking at Unknown  time    Patient Stressors: Legal issue Marital or family conflict  Patient Strengths: Ability for insight Motivation for treatment/growth  Treatment Modalities: Medication Management, Group therapy, Case management,  1 to 1 session with clinician, Psychoeducation, Recreational therapy.   Physician Treatment Plan for Primary Diagnosis: <principal problem not specified> Long Term Goal(s):     Short Term Goals:    Medication Management: Evaluate patient's response, side effects, and tolerance of medication regimen.  Therapeutic Interventions: 1 to 1 sessions, Unit Group sessions and Medication administration.  Evaluation  of Outcomes: Progressing  Physician Treatment Plan for Secondary Diagnosis: Active Problems:   Severe recurrent major depression without psychotic features (HCC)  Long Term Goal(s):     Short Term Goals:       Medication Management: Evaluate patient's response, side effects, and tolerance of medication regimen.  Therapeutic Interventions: 1 to 1 sessions, Unit Group sessions and Medication administration.  Evaluation of Outcomes: Progressing   RN Treatment Plan for Primary Diagnosis: <principal problem not specified> Long Term Goal(s): Knowledge of disease and therapeutic regimen to maintain health will improve  Short Term Goals: Ability to demonstrate self-control, Ability to participate in decision making will improve, Ability to verbalize feelings will improve and Ability to disclose and discuss suicidal ideas  Medication Management: RN will administer medications as ordered by provider, will assess and evaluate patient's response and provide education to patient for prescribed medication. RN will report any adverse and/or side effects to prescribing provider.  Therapeutic Interventions: 1 on 1 counseling sessions, Psychoeducation, Medication administration, Evaluate responses to treatment, Monitor vital signs and CBGs as ordered, Perform/monitor CIWA, COWS, AIMS and Fall Risk screenings as ordered, Perform wound care treatments as ordered.  Evaluation of Outcomes: Progressing   LCSW Treatment Plan for Primary Diagnosis: <principal problem not specified> Long Term Goal(s): Safe transition to appropriate next level of care at discharge, Engage patient in therapeutic group addressing interpersonal concerns.  Short Term Goals: Engage patient in aftercare planning with referrals and resources, Increase social support, Increase ability to appropriately verbalize feelings, Increase emotional regulation and Facilitate acceptance of mental health diagnosis and concerns  Therapeutic  Interventions: Assess for all discharge needs, 1 to 1 time with Social worker, Explore available resources and support systems, Assess for adequacy in community support network, Educate family and significant other(s) on suicide prevention, Complete Psychosocial Assessment, Interpersonal group therapy.  Evaluation of Outcomes: Progressing   Progress in Treatment: Attending groups: Yes. Participating in groups: Yes. Taking medication as prescribed: Yes. Toleration medication: Yes. Family/Significant other contact made: No, will contact:  once permission given. Patient understands diagnosis: Yes. Discussing patient identified problems/goals with staff: Yes. Medical problems stabilized or resolved: Yes. Denies suicidal/homicidal ideation: Yes. Issues/concerns per patient self-inventory: No. Other: none   New problem(s) identified: No, Describe:  none  New Short Term/Long Term Goal(s): medication management for mood stabilization; elimination of SI thoughts; development of comprehensive mental wellness/sobriety plan.  Patient Goals:  "feel better about myself"  Discharge Plan or Barriers: Pt has not identified any barriers at this time.   Reason for Continuation of Hospitalization: Anxiety Depression Medication stabilization Suicidal ideation Withdrawal symptoms  Estimated Length of Stay: 1-5 days  Recreational Therapy: Patient Stressors: N/A Patient Goal: Patient will identify 3 positive ways of expressing themselves within 5 recreation therapy group sessions  Attendees: Patient: Pamela Sanford 07/22/2018 3:23 PM  Physician: Dr. Toni Amend, MD 07/22/2018 3:23 PM  Nursing:  07/22/2018 3:23 PM  RN Care Manager: 07/22/2018 3:23 PM  Social Worker:  Penni Homans, [place]  07/22/2018 3:23 PM  Recreational Therapist: Hilbert Bible, LRT 07/22/2018 3:23 PM  Other: Iris Pert, LCSW 07/22/2018 3:23 PM  Other:  07/22/2018 3:23 PM  Other: 07/22/2018 3:23 PM    Scribe for Treatment  Team: Harden Mo, LCSW 07/22/2018 3:23 PM

## 2018-07-22 NOTE — H&P (Signed)
Psychiatric Admission Assessment Adult  Patient Identification: Pamela Sanford MRN:  [id number] Date of Evaluation:  07/22/2018 Chief Complaint:  Major Depression  Principal Diagnosis: Adjustment disorder with mixed disturbance of emotions and conduct Diagnosis:  Principal Problem:   Adjustment disorder with mixed disturbance of emotions and conduct Active Problems:   Severe recurrent major depression without psychotic features (HCC)   Cocaine abuse (HCC)  History of Present Illness: 38 year old woman who was brought in under IVC filed by a friend of hers after the patient texted some messages that sounded distinctly suicidal.  Patient says she has been under a great deal of stress recently.  Her children have been taken away from her and put into foster care.  Patient is being investigated by DSS.  This weekend she relapsed on cocaine.  She feels dysphoric and nervous much of the time.  Not sleeping well but she denies that she had any suicidal intention or plan.  Denies any psychotic symptoms.  Patient had been on antidepressant medicine had had a brief period however of not taking them recently. Associated Signs/Symptoms: Depression Symptoms:  psychomotor retardation, anxiety, (Hypo) Manic Symptoms:  Impulsivity, Anxiety Symptoms:  Excessive Worry, Psychotic Symptoms:  None PTSD Symptoms: Negative Total Time spent with patient: 1 hour  Past Psychiatric History: Patient has a history of depression.  Suicide attempt as a teen.  Prior use of antidepressant medicine.  Is the patient at risk to self? No.  Has the patient been a risk to self in the past 6 months? No.  Has the patient been a risk to self within the distant past? Yes.    Is the patient a risk to others? No.  Has the patient been a risk to others in the past 6 months? No.  Has the patient been a risk to others within the distant past? No.   Prior Inpatient Therapy:   Prior Outpatient Therapy:    Alcohol Screening: 1.  How often do you have a drink containing alcohol?: Never 2. How many drinks containing alcohol do you have on a typical day when you are drinking?: 1 or 2 3. How often do you have six or more drinks on one occasion?: Never AUDIT-C Score: 0 4. How often during the last year have you found that you were not able to stop drinking once you had started?: Never 5. How often during the last year have you failed to do what was normally expected from you becasue of drinking?: Never 6. How often during the last year have you needed a first drink in the morning to get yourself going after a heavy drinking session?: Never 7. How often during the last year have you had a feeling of guilt of remorse after drinking?: Never 8. How often during the last year have you been unable to remember what happened the night before because you had been drinking?: Never 9. Have you or someone else been injured as a result of your drinking?: No 10. Has a relative or friend or a doctor or another health worker been concerned about your drinking or suggested you cut down?: No Alcohol Use Disorder Identification Test Final Score (AUDIT): 0 Alcohol Brief Interventions/Follow-up: AUDIT Score <7 follow-up not indicated Substance Abuse History in the last 12 months:  Yes.   Consequences of Substance Abuse: Legal Consequences:  Problems with DSS and child custody Previous Psychotropic Medications: Yes  Psychological Evaluations: Yes  Past Medical History:  Past Medical History:  Diagnosis Date  . Asthma   .  Depression   . Foot fracture   . GERD (gastroesophageal reflux disease)     Past Surgical History:  Procedure Laterality Date  . LAPAROSCOPIC TUBAL LIGATION    . MULTIPLE TOOTH EXTRACTIONS    . WISDOM TOOTH EXTRACTION     Family History: History reviewed. No pertinent family history. Family Psychiatric  History: Positive for depression Tobacco Screening: Have you used any form of tobacco in the last 30 days?  (Cigarettes, Smokeless Tobacco, Cigars, and/or Pipes): Yes Tobacco use, Select all that apply: 4 or less cigarettes per day Are you interested in Tobacco Cessation Medications?: No, patient refused Counseled patient on smoking cessation including recognizing danger situations, developing coping skills and basic information about quitting provided: Refused/Declined practical counseling Social History:  Social History   Substance and Sexual Activity  Alcohol Use No     Social History   Substance and Sexual Activity  Drug Use No    Additional Social History: Marital status: Single Are you sexually active?: No What is your sexual orientation?: Heterosexual Has your sexual activity been affected by drugs, alcohol, medication, or emotional stress?: Pt denies.  Does patient have children?: Yes How many children?: 3 How is patient's relationship with their children?: Pt reports that her children (13, 11, 3) are no longer in her care. Pt reports that "it was good, then they stopped talking to me when they were removed and had just started talking to me again."                         Allergies:   Allergies  Allergen Reactions  . Ibuprofen Nausea Only  . Tramadol Nausea Only   Lab Results:  Results for orders placed or performed during the hospital encounter of 07/21/18 (from the past 48 hour(s))  Lipid panel     Status: Abnormal   Collection Time: 07/22/18  7:21 AM  Result Value Ref Range   Cholesterol 143 0 - 200 mg/dL   Triglycerides 583 <094 mg/dL   HDL 30 (L) >07 mg/dL   Total CHOL/HDL Ratio 4.8 RATIO   VLDL 26 0 - 40 mg/dL   LDL Cholesterol 87 0 - 99 mg/dL    Comment:        Total Cholesterol/HDL:CHD Risk Coronary Heart Disease Risk Table                     Men   Women  1/2 Average Risk   3.4   3.3  Average Risk       5.0   4.4  2 X Average Risk   9.6   7.1  3 X Average Risk  23.4   11.0        Use the calculated Patient Ratio above and the CHD Risk  Table to determine the patient's CHD Risk.        ATP III CLASSIFICATION (LDL):  <100     mg/dL   Optimal  680-881  mg/dL   Near or Above                    Optimal  130-159  mg/dL   Borderline  103-159  mg/dL   High  >458     mg/dL   Very High Performed at [facility], [address] [place], [place] [address]     Blood Alcohol level:  Lab Results  Component Value Date   [facility] <10 07/20/2018   [facility]  04/12/2010    <5        LOWEST DETECTABLE LIMIT FOR SERUM ALCOHOL IS 5 mg/dL FOR MEDICAL PURPOSES ONLY    Metabolic Disorder Labs:  No results found for: HGBA1C, MPG No results found for: PROLACTIN Lab Results  Component Value Date   CHOL 143 07/22/2018   TRIG 131 07/22/2018   HDL 30 (L) 07/22/2018   CHOLHDL 4.8 07/22/2018   VLDL 26 07/22/2018   LDLCALC 87 07/22/2018   LDLCALC 84 07/21/2018    Current Medications: Current Facility-Administered Medications  Medication Dose Route Frequency Provider Last Rate Last Dose  . acetaminophen (TYLENOL) tablet 650 mg  650 mg Oral Q6H PRN Nira ConnBerry, Jason A, NP   650 mg at 07/22/18 1438  . albuterol (PROVENTIL HFA;VENTOLIN HFA) 108 (90 Base) MCG/ACT inhaler 2 puff  2 puff Inhalation Q6H PRN Mariel CraftMaurer, Sheila M, MD      . alum & mag hydroxide-simeth (MAALOX/MYLANTA) 200-200-20 MG/5ML suspension 30 mL  30 mL Oral Q4H PRN Nira ConnBerry, Jason A, NP      . hydrOXYzine (ATARAX/VISTARIL) tablet 25 mg  25 mg Oral TID PRN Jackelyn PolingBerry, Jason A, NP   25 mg at 07/21/18 2326  . magnesium hydroxide (MILK OF MAGNESIA) suspension 30 mL  30 mL Oral Daily PRN Nira ConnBerry, Jason A, NP      . nitrofurantoin (macrocrystal-monohydrate) (MACROBID) capsule 100 mg  100 mg Oral Q12H Mariel CraftMaurer, Sheila M, MD   100 mg at 07/22/18 0815  . sertraline (ZOLOFT) tablet 50 mg  50 mg Oral Daily , Jackquline DenmarkJohn T, MD   50 mg at 07/22/18 1432  . traZODone (DESYREL) tablet 50 mg  50 mg Oral QHS PRN Nira ConnBerry, Jason A, NP   50 mg at 07/21/18 2326   PTA Medications: Medications Prior to Admission   Medication Sig Dispense Refill Last Dose  . acetaminophen-codeine (TYLENOL #3) 300-30 MG tablet Take 1-2 tablets by mouth every 6 (six) hours as needed. (Patient taking differently: Take 1-2 tablets by mouth every 6 (six) hours as needed for moderate pain. ) 12 tablet 0 Past Month at Unknown time  . albuterol (PROVENTIL HFA;VENTOLIN HFA) 108 (90 Base) MCG/ACT inhaler Inhale 2 puffs into the lungs every 6 (six) hours as needed for wheezing or shortness of breath.   Past Week at Unknown time  . albuterol (PROVENTIL) (5 MG/ML) 0.5% nebulizer solution Take 2.5 mg by nebulization every 4 (four) hours as needed for wheezing or shortness of breath.   Not Taking at Unknown time  . amoxicillin (AMOXIL) 500 MG capsule Take 1 capsule (500 mg total) by mouth 3 (three) times daily. (Patient not taking: Reported on 12/18/2015) 21 capsule 0 Not Taking at Unknown time  . benazepril (LOTENSIN) 5 MG tablet Take by mouth.   Past Month at Unknown time  . cephALEXin (KEFLEX) 500 MG capsule Take 1 capsule (500 mg total) by mouth 2 (two) times daily. 14 capsule 0   . HYDROcodone-acetaminophen (NORCO/VICODIN) 5-325 MG tablet Take 1 tablet by mouth every 4 (four) hours as needed. (Patient not taking: Reported on 12/18/2015) 10 tablet 0 Not Taking at Unknown time  . methocarbamol (ROBAXIN) 500 MG tablet Take 1 tablet (500 mg total) by mouth 2 (two) times daily. (Patient not taking: Reported on 12/18/2015) 20 tablet 0 Not Taking at Unknown time  . naproxen (NAPROSYN) 500 MG tablet Take 1 tablet (500 mg total) by mouth 2 (two) times daily as needed. 12 tablet 0   . omeprazole (PRILOSEC OTC) 20 MG tablet  Take 20 mg by mouth daily.   Past Month at Unknown time  . ondansetron (ZOFRAN) 4 MG tablet Take 1 tablet (4 mg total) by mouth every 6 (six) hours. (Patient not taking: Reported on 09/21/2015) 12 tablet 0 Not Taking at Unknown time    Musculoskeletal: Strength & Muscle Tone: within normal limits Gait & Station: normal Patient  leans: N/A  Psychiatric Specialty Exam: Physical Exam  ROS  Blood pressure 108/67, pulse 75, temperature 98.2 F (36.8 C), temperature source Oral, resp. rate 18, height 5\' 2"  (1.575 m), weight 73.5 kg, last menstrual period 06/24/2018, SpO2 100 %.Body mass index is 29.63 kg/m.  General Appearance: Casual  Eye Contact:  Good  Speech:  Clear and Coherent  Volume:  Normal  Mood:  Euthymic  Affect:  Depressed  Thought Process:  Coherent  Orientation:  Full (Time, Place, and Person)  Thought Content:  Logical  Suicidal Thoughts:  No  Homicidal Thoughts:  No  Memory:  Immediate;   Fair Recent;   Fair Remote;   Fair  Judgement:  Fair  Insight:  Fair  Psychomotor Activity:  Decreased  Concentration:  Concentration: Fair  Recall:  [employer] of Knowledge:  Fair  Language:  Fair  Akathisia:  No  Handed:  Right  AIMS (if indicated):     Assets:  Desire for Improvement  ADL's:  Intact  Cognition:  WNL  Sleep:  Number of Hours: 5    Treatment Plan Summary: Daily contact with patient to assess and evaluate symptoms and progress in treatment, Medication management and Plan Restart Zoloft.  Engage in individual and group therapy.  Ongoing assessment of mood.  Work on appropriate discharge planning.  Likely length of stay 2 to 3 days  Observation Level/Precautions:  15 minute checks  Laboratory:  Chemistry Profile  Psychotherapy:    Medications:    Consultations:    Discharge Concerns:    Estimated LOS:  Other:     Physician Treatment Plan for Primary Diagnosis: Adjustment disorder with mixed disturbance of emotions and conduct Long Term Goal(s): Improvement in symptoms so as ready for discharge  Short Term Goals: Ability to disclose and discuss suicidal ideas  Physician Treatment Plan for Secondary Diagnosis: Principal Problem:   Adjustment disorder with mixed disturbance of emotions and conduct Active Problems:   Severe recurrent major depression without psychotic features  (HCC)   Cocaine abuse (HCC)  Long Term Goal(s): Improvement in symptoms so as ready for discharge  Short Term Goals: Compliance with prescribed medications will improve  I certify that inpatient services furnished can reasonably be expected to improve the patient's condition.    Mordecai Rasmussen, MD 2/19/20206:13 PM

## 2018-07-22 NOTE — Plan of Care (Signed)
Patient newly admitted, hasn't had time to progress.    Problem: Education: Goal: Knowledge of [facility] Education information/materials will improve Outcome: Not Progressing Goal: Emotional status will improve Outcome: Not Progressing Goal: Mental status will improve Outcome: Not Progressing Goal: Verbalization of understanding the information provided will improve Outcome: Not Progressing   Problem: Safety: Goal: Periods of time without injury will increase Outcome: Not Progressing   Problem: Education: Goal: Ability to make informed decisions regarding treatment will improve Outcome: Not Progressing   Problem: Self-Concept: Goal: Ability to disclose and discuss suicidal ideas will improve Outcome: Not Progressing Goal: Will verbalize positive feelings about self Outcome: Not Progressing   

## 2018-07-23 LAB — URINE CULTURE: Culture: <10000 — AB

## 2018-07-23 LAB — HEMOGLOBIN A1C
HEMOGLOBIN A1C: 5.1 % (ref 4.8–5.6)
Hgb A1c MFr Bld: 5.3 % (ref 4.8–5.6)
Mean Plasma Glucose: 100 mg/dL
Mean Plasma Glucose: 105 mg/dL

## 2018-07-23 MED ORDER — SERTRALINE HCL 50 MG PO TABS: 50.0000 mg | ORAL_TABLET | Freq: Every day | ORAL | 1 refills | Status: DC

## 2018-07-23 MED ORDER — TRAZODONE HCL 50 MG PO TABS: 50.0000 mg | ORAL_TABLET | Freq: Every evening | ORAL | 1 refills | Status: DC | PRN

## 2018-07-23 MED ORDER — SERTRALINE HCL 50 MG PO TABS: 50.0000 mg | ORAL_TABLET | Freq: Every day | ORAL | 0 refills | Status: DC

## 2018-07-23 MED ORDER — ALBUTEROL SULFATE HFA 108 (90 BASE) MCG/ACT IN AERS: 2.0000 | INHALATION_SPRAY | Freq: Four times a day (QID) | RESPIRATORY_TRACT | 0 refills | Status: AC | PRN

## 2018-07-23 MED ORDER — TRAZODONE HCL 50 MG PO TABS: 50.0000 mg | ORAL_TABLET | Freq: Every evening | ORAL | 0 refills | Status: DC | PRN

## 2018-07-23 MED ORDER — NITROFURANTOIN MONOHYD MACRO 100 MG PO CAPS: 100.0000 mg | ORAL_CAPSULE | Freq: Two times a day (BID) | ORAL | 0 refills | Status: DC

## 2018-07-23 NOTE — Progress Notes (Signed)
[facility]  07/23/2018 4:12 PM Pamela Sanford  MRN:  [id number] Subjective: Follow-up for this patient with depression and anxiety symptoms.  Patient reports she is feeling better today.  Slept okay last night.  Calm and lucid and better able to deal with her stress.  Denies any suicidal or homicidal ideation.  Denies any psychosis. Principal Problem: Adjustment disorder with mixed disturbance of emotions and conduct Diagnosis: Principal Problem:   Adjustment disorder with mixed disturbance of emotions and conduct Active Problems:   Severe recurrent major depression without psychotic features (HCC)   Cocaine abuse (HCC)  Total Time spent with patient: 30 minutes  Past Psychiatric History: Patient has a history of substance abuse and mood symptoms  Past Medical History:  Past Medical History:  Diagnosis Date  . Asthma   . Depression   . Foot fracture   . GERD (gastroesophageal reflux disease)     Past Surgical History:  Procedure Laterality Date  . LAPAROSCOPIC TUBAL LIGATION    . MULTIPLE TOOTH EXTRACTIONS    . WISDOM TOOTH EXTRACTION     Family History: History reviewed. No pertinent family history. Family Psychiatric  History: None Social History:  Social History   Substance and Sexual Activity  Alcohol Use No     Social History   Substance and Sexual Activity  Drug Use No    Social History   Socioeconomic History  . Marital status: Single    Spouse name: Not on file  . Number of children: Not on file  . Years of education: Not on file  . Highest education level: Not on file  Occupational History  . Not on file  Social Needs  . Financial resource strain: Not on file  . Food insecurity:    Worry: Not on file    Inability: Not on file  . Transportation needs:    Medical: Not on file    Non-medical: Not on file  Tobacco Use  . Smoking status: Current Every Day Smoker    Packs/day: 0.04    Years: 8.00    Pack years: 0.32    Types:  Cigarettes  . Smokeless tobacco: Never Used  Substance and Sexual Activity  . Alcohol use: No  . Drug use: No  . Sexual activity: Not on file  Lifestyle  . Physical activity:    Days per week: Not on file    Minutes per session: Not on file  . Stress: Not on file  Relationships  . Social connections:    Talks on phone: Not on file    Gets together: Not on file    Attends religious service: Not on file    Active member of club or organization: Not on file    Attends meetings of clubs or organizations: Not on file    Relationship status: Not on file  Other Topics Concern  . Not on file  Social History Narrative  . Not on file   Additional Social History:                         Sleep: Fair  Appetite:  Fair  Current Medications: Current Facility-Administered Medications  Medication Dose Route Frequency Provider Last Rate Last Dose  . acetaminophen (TYLENOL) tablet 650 mg  650 mg Oral Q6H PRN Nira ConnBerry, Jason A, NP   650 mg at 07/23/18 1245  . albuterol (PROVENTIL HFA;VENTOLIN HFA) 108 (90 Base) MCG/ACT inhaler 2 puff  2 puff Inhalation Q6H  PRN Mariel Craft, MD      . alum & mag hydroxide-simeth (MAALOX/MYLANTA) 200-200-20 MG/5ML suspension 30 mL  30 mL Oral Q4H PRN Nira Conn A, NP      . hydrOXYzine (ATARAX/VISTARIL) tablet 25 mg  25 mg Oral TID PRN Jackelyn Poling, NP   25 mg at 07/22/18 2126  . magnesium hydroxide (MILK OF MAGNESIA) suspension 30 mL  30 mL Oral Daily PRN Nira Conn A, NP      . nitrofurantoin (macrocrystal-monohydrate) (MACROBID) capsule 100 mg  100 mg Oral Q12H Mariel Craft, MD   100 mg at 07/23/18 0820  . sertraline (ZOLOFT) tablet 50 mg  50 mg Oral Daily Shakesha Soltau, Jackquline Denmark, MD   50 mg at 07/23/18 0820  . traZODone (DESYREL) tablet 50 mg  50 mg Oral QHS PRN Jackelyn Poling, NP   50 mg at 07/22/18 2127    Lab Results:  Results for orders placed or performed during the hospital encounter of 07/21/18 (from the past 48 hour(s))  Hemoglobin A1c      Status: None   Collection Time: 07/22/18  7:21 AM  Result Value Ref Range   Hgb A1c MFr Bld 5.3 4.8 - 5.6 %    Comment: (NOTE)         Prediabetes: 5.7 - 6.4         Diabetes: >6.4         Glycemic control for adults with diabetes: <7.0    Mean Plasma Glucose 105 mg/dL    Comment: (NOTE) Performed At: [facility] [address] [place], [place] [id number] Jolene Schimke MD [id number]   Lipid panel     Status: Abnormal   Collection Time: 07/22/18  7:21 AM  Result Value Ref Range   Cholesterol 143 0 - 200 mg/dL   Triglycerides 867 <619 mg/dL   HDL 30 (L) >50 mg/dL   Total CHOL/HDL Ratio 4.8 RATIO   VLDL 26 0 - 40 mg/dL   LDL Cholesterol 87 0 - 99 mg/dL    Comment:        Total Cholesterol/HDL:CHD Risk Coronary Heart Disease Risk Table                     Men   Women  1/2 Average Risk   3.4   3.3  Average Risk       5.0   4.4  2 X Average Risk   9.6   7.1  3 X Average Risk  23.4   11.0        Use the calculated Patient Ratio above and the CHD Risk Table to determine the patient's CHD Risk.        ATP III CLASSIFICATION (LDL):  <100     mg/dL   Optimal  932-671  mg/dL   Near or Above                    Optimal  130-159  mg/dL   Borderline  245-809  mg/dL   High  >983     mg/dL   Very High Performed at [facility], [address] [place], [place] [address]     Blood Alcohol level:  Lab Results  Component Value Date   ETH <10 07/20/2018   [facility]  04/12/2010    <5        LOWEST DETECTABLE LIMIT FOR SERUM ALCOHOL IS 5 mg/dL FOR MEDICAL PURPOSES ONLY    Metabolic Disorder  Labs: Lab Results  Component Value Date   HGBA1C 5.3 07/22/2018   MPG 105 07/22/2018   MPG 100 07/21/2018   No results found for: PROLACTIN Lab Results  Component Value Date   CHOL 143 07/22/2018   TRIG 131 07/22/2018   HDL 30 (L) 07/22/2018   CHOLHDL 4.8 07/22/2018   VLDL 26 07/22/2018   LDLCALC 87 07/22/2018   LDLCALC 84 07/21/2018    Physical  Findings: AIMS:  , ,  ,  ,    CIWA:    COWS:     Musculoskeletal: Strength & Muscle Tone: within normal limits Gait & Station: normal Patient leans: N/A  Psychiatric Specialty Exam: Physical Exam  Nursing note and vitals reviewed. Constitutional: She appears well-developed and well-nourished.  HENT:  Head: Normocephalic and atraumatic.  Eyes: Pupils are equal, round, and reactive to light. Conjunctivae are normal.  Neck: Normal range of motion.  Cardiovascular: Regular rhythm and normal heart sounds.  Respiratory: Effort normal. No respiratory distress.  GI: Soft.  Musculoskeletal: Normal range of motion.  Neurological: She is alert.  Skin: Skin is warm and dry.  Psychiatric: She has a normal mood and affect. Her behavior is normal. Judgment and thought content normal.    Review of Systems  Constitutional: Negative.   HENT: Negative.   Eyes: Negative.   Respiratory: Negative.   Cardiovascular: Negative.   Gastrointestinal: Negative.   Musculoskeletal: Negative.   Skin: Negative.   Neurological: Negative.   Psychiatric/Behavioral: Negative.     Blood pressure 112/61, pulse 81, temperature 98.6 F (37 C), temperature source Oral, resp. rate 16, height 5\' 2"  (1.575 m), weight 73.5 kg, last menstrual period 06/24/2018, SpO2 100 %.Body mass index is 29.63 kg/m.  General Appearance: Casual  Eye Contact:  Good  Speech:  Clear and Coherent  Volume:  Normal  Mood:  Euthymic  Affect:  Congruent  Thought Process:  Goal Directed  Orientation:  Full (Time, Place, and Person)  Thought Content:  Logical  Suicidal Thoughts:  No  Homicidal Thoughts:  No  Memory:  Immediate;   Fair Recent;   Fair Remote;   Fair  Judgement:  Fair  Insight:  Fair  Psychomotor Activity:  Normal  Concentration:  Concentration: Fair  Recall:  [employer] of Knowledge:  Fair  Language:  Fair  Akathisia:  No  Handed:  Right  AIMS (if indicated):     Assets:  Desire for  Improvement Housing Physical Health Resilience  ADL's:  Intact  Cognition:  WNL  Sleep:  Number of Hours: 7.3     Treatment Plan Summary: Daily contact with patient to assess and evaluate symptoms and progress in treatment, Medication management and Plan No change to medication.  Supportive and educational counseling completed.  Discussed discharge planning options for tomorrow.  Patient is agreeable to following up with local mental health in her home community.  Mordecai Rasmussen, MD 07/23/2018, 4:12 PM

## 2018-07-23 NOTE — [facility] Counselor (Signed)
CSW spoke with Remi Haggard, [facility] Social Worker at  [facility], [phone] in regards to if the patient will be able to return to the hotel upon dishcarge.  CSW was infomred that SW would have to contact several parties and afollow up with this CSW once she determined if the patient could.   She reports that patient was staying at the [employer] and [address] in [place].    SW asked for medical records and CSW referred to Medical Records upon the patient's discharge and provided the contact information [phone].  Penni Homans, MSW, LCSW 07/23/2018 11:38 AM

## 2018-07-23 NOTE — Plan of Care (Signed)
Pt. Denies si/hi/avh, can contract for safety. Pt. Reports able to remain safe while on the unit. Pt. Endorses an anxious mood and is frequently isolative this evening.    Problem: Education: Goal: Emotional status will improve Outcome: Not Progressing Goal: Mental status will improve Outcome: Not Progressing   Problem: Safety: Goal: Periods of time without injury will increase Outcome: Progressing   Problem: Self-Concept: Goal: Ability to disclose and discuss suicidal ideas will improve Outcome: Progressing

## 2018-07-23 NOTE — BHH Suicide Risk Assessment (Signed)
[facility] Discharge Suicide Risk Assessment   Principal Problem: Adjustment disorder with mixed disturbance of emotions and conduct Discharge Diagnoses: Principal Problem:   Adjustment disorder with mixed disturbance of emotions and conduct Active Problems:   Severe recurrent major depression without psychotic features (HCC)   Cocaine abuse (HCC)   Total Time spent with patient: 45 minutes  Musculoskeletal: Strength & Muscle Tone: within normal limits Gait & Station: normal Patient leans: N/A  Psychiatric Specialty Exam: Review of Systems  Constitutional: Negative.   HENT: Negative.   Eyes: Negative.   Respiratory: Negative.   Cardiovascular: Negative.   Gastrointestinal: Negative.   Musculoskeletal: Negative.   Skin: Negative.   Neurological: Negative.   Psychiatric/Behavioral: Negative.     Blood pressure 112/61, pulse 81, temperature 98.6 F (37 C), temperature source Oral, resp. rate 16, height 5\' 2"  (1.575 m), weight 73.5 kg, last menstrual period 06/24/2018, SpO2 100 %.Body mass index is 29.63 kg/m.  General Appearance: Casual  Eye Contact::  Good  Speech:  Slow409  Volume:  Decreased  Mood:  Euthymic  Affect:  Congruent  Thought Process:  Coherent  Orientation:  Full (Time, Place, and Person)  Thought Content:  Logical  Suicidal Thoughts:  No  Homicidal Thoughts:  No  Memory:  Immediate;   Fair Recent;   Fair Remote;   Fair  Judgement:  Fair  Insight:  Fair  Psychomotor Activity:  Decreased  Concentration:  Fair  Recall:  [employer] of Knowledge:Fair  Language: Fair  Akathisia:  No  Handed:  Right  AIMS (if indicated):     Assets:  Desire for Improvement Housing Physical Health Resilience  Sleep:  Number of Hours: 7.3  Cognition: WNL  ADL's:  Intact   Mental Status Per Nursing Assessment::   On Admission:  NA  Demographic Factors:  NA  Loss Factors: Financial problems/change in socioeconomic status  Historical Factors: Impulsivity  Risk  Reduction Factors:   Responsible for children under 34 years of age, Sense of responsibility to family, Religious beliefs about death, Positive social support and Positive therapeutic relationship  Continued Clinical Symptoms:  Depression:   Anhedonia  Cognitive Features That Contribute To Risk:  Closed-mindedness    Suicide Risk:  Minimal: No identifiable suicidal ideation.  Patients presenting with no risk factors but with morbid ruminations; may be classified as minimal risk based on the severity of the depressive symptoms  Follow-up Information    [facility] Follow up on 07/29/2018.   Why:  Please attend appointment on 2/26 at 9AM. Please bring ID and SS card. Contact information: [address] [place] [address] [phone]           Plan Of Care/Follow-up recommendations:  Activity:  Activity as tolerated Diet:  Regular diet Other:  Follow up in [address] with outpatient mental health and substance abuse treatment  Mordecai Rasmussen, MD 07/23/2018, 4:35 PM

## 2018-07-23 NOTE — B[facility] Suicide Risk Assessment (Signed)
[facility] INPATIENT:  Family/Significant Other Suicide Prevention Education  Suicide Prevention Education:  Patient Refusal for Family/Significant Other Suicide Prevention Education: The patient Pamela Sanford has refused to provide written consent for family/significant other to be provided Family/Significant Other Suicide Prevention Education during admission and/or prior to discharge.  Physician notified.  Harden Mo 07/23/2018, 11:35 AM

## 2018-07-23 NOTE — BHH Group Notes (Signed)
Balance In Life 07/23/2018 1PM  Type of Therapy/Topic:  Group Therapy:  Balance in Life  Participation Level:  Minimal  Description of Group:   This group will address the concept of balance and how it feels and looks when one is unbalanced. Patients will be encouraged to process areas in their lives that are out of balance and identify reasons for remaining unbalanced. Facilitators will guide patients in utilizing problem-solving interventions to address and correct the stressor making their life unbalanced. Understanding and applying boundaries will be explored and addressed for obtaining and maintaining a balanced life. Patients will be encouraged to explore ways to assertively make their unbalanced needs known to significant others in their lives, using other group members and facilitator for support and feedback.  Therapeutic Goals: 1. Patient will identify two or more emotions or situations they have that consume much of in their lives. 2. Patient will identify signs/triggers that life has become out of balance:  3. Patient will identify two ways to set boundaries in order to achieve balance in their lives:  4. Patient will demonstrate ability to communicate their needs through discussion and/or role plays  Summary of Patient Progress: Minimal participation during session. Pt sat quietly but did participate in group icebreaker.   Therapeutic Modalities:   Cognitive Behavioral Therapy Solution-Focused Therapy Assertiveness Training  Pamela Sanford Philip Aspen, LCSW

## 2018-07-23 NOTE — Progress Notes (Signed)
D: Pt during assessments denies SI/HI/AVH, can contract for safety. Pt is pleasant and cooperative, but minimal and forwards little. Pt. Eye contact is avertive and her affect is mostly flat and sad. Despite this, reports depression 1/10 and anxiety 6/10. Pt. Reports her mood as mostly anxious. Pt. Asked if she would like medication for reported pains, but declines. Pt. Encouraged to come up If needed later.   A: Q x 15 minute observation checks were completed for safety. Patient was provided with education. Patient was given/offered medications per orders. Patient  was encourage to attend groups, participate in unit activities and continue with plan of care. Pt. Chart and plans of care reviewed. Pt. Given support and encouragement.   R: Patient is complaint with medications. Pt. Does not attend group or snack time this evening. Pt. Frequently isolative and withdrawn.             Precautionary checks every 15 minutes for safety maintained, room free of safety hazards, patient sustains no injury or falls during this shift. Will endorse care to next shift.

## 2018-07-23 NOTE — Progress Notes (Signed)
Recreation Therapy Notes  Date: 07/23/2018  Time: 9:30 am  Location: Craft Room  Behavioral response: Appropriate  Intervention Topic: Communication  Discussion/Intervention:  Group content today was focused on communication. The group defined communication and ways to communicate with others. Individuals stated reason why communication is important and some reasons to communicate with others. Patients expressed if they thought they were good at communicating with others and ways they could improve their communication skills. The group identified important parts of communication and some experiences they have had in the past with communication. The group participated in the intervention "What is that?", where they had a chance to test out their communication skills and identify ways to improve their communication techniques.  Clinical Observations/Feedback:  Patient came to group and was focused on what peers and staff had to say about communication. She did not engage with peers during intervention.  Pamela Sanford LRT/CTRS         Pamela Sanford 07/23/2018 11:08 AM

## 2018-07-23 NOTE — Plan of Care (Signed)
Patient is alert and oriented to person, place and time. Present in the milieu with a steady gait. Patient observed interacting selectively with peers. Compliant with meals and medications. Complained of a headache this afternoon, some relief given to patient after prn Tylenol administered. Denies SI/HI/AVH and pain at this time. Milieu remains safe with q 15 minute safety checks. Will continue to monitor.

## 2018-07-24 NOTE — Progress Notes (Signed)
D - Patient was in the dayroom upon arrival to the unit. Patient was pleasant during assessment and medication administration. Patient denies SI/HI/AVH, pain, and depression. Patient stated, "My anxiety is a 3/10 because I am going home tomorrow but the weather is so bad, I am not sure if I will be able to get there." Patient given education.   A - Patient compliant with medication administration per MD orders and procedures on the unit. Patient give education. Patient given support and encouragement. Patient informed to let staff know if there are any issues or problems on the unit.   R - Patient being monitored Q 15 minutes for safety per unit protocol. Patient remains safe on the unit.

## 2018-07-24 NOTE — Plan of Care (Signed)
Patient stated to this writer that she is feeling better today than when she came in. Patient said she had a little anxiety because she is leaving tomorrow and the weather is bad tonight.   Problem: Education: Goal: Emotional status will improve Outcome: Progressing Goal: Mental status will improve Outcome: Progressing

## 2018-07-24 NOTE — Progress Notes (Signed)
Patient alert and oriented x 4. Ambulates unit with steady gait. Verbally denies SI/HI/AVH and pain. Patient discharged on above date and time. Verbalized understanding the discharge information provided to patient upon discharge. Patient departed unit with discharge paperwork, prescriptions, 7 day supply of medications and personal belongings. Picked up by [employer] to be transported to [place]. No distress noted.

## 2018-07-24 NOTE — Progress Notes (Signed)
  [facility] Adult Case Management Discharge Plan :  Will you be returning to the same living situation after discharge:  Yes,  DSS reports that pt can return to the [facility] and [place] and that patient may potentially have housing in March. At discharge, do you have transportation home?: Yes,  CSW provided pt with a cab voucher. Do you have the ability to pay for your medications: Yes,  CSW will complete medication application if needed.   Release of information consent forms completed and in the chart;  Patient's signature needed at discharge.  Patient to Follow up at: Follow-up Information    [facility] Follow up on 07/29/2018.   Why:  Please attend appointment on 2/26 at 9AM. Please bring ID and SS card. Contact information: [address] [place] [address] [phone]           Next level of care provider has access to [facility] Link:no  Safety Planning and Suicide Prevention discussed: Yes,  SPE completed with the patient as pt did not identify family or friend collateral.   Have you used any form of tobacco in the last 30 days? (Cigarettes, Smokeless Tobacco, Cigars, and/or Pipes): Yes  Has patient been referred to the Quitline?: Patient refused referral  Patient has been referred for addiction treatment: Yes  Harden Mo, LCSW 07/24/2018, 9:28 AM

## 2018-07-24 NOTE — Discharge Summary (Signed)
Physician Discharge Summary Note  Patient:  Pamela Sanford is an 38 y.o., female MRN:  [id number] DOB:  08/24/1980 Patient phone:  [phone] (home)  Patient address:   [address] [place] [address],  Total Time spent with patient: 45 minutes  Date of Admission:  07/21/2018 Date of Discharge: July 24, 2018  Reason for Admission: Patient admitted through the emergency room where she was brought in under IVC because she had texted an acquaintance some notes that sounded suicidal  Principal Problem: Adjustment disorder with mixed disturbance of emotions and conduct Discharge Diagnoses: Principal Problem:   Adjustment disorder with mixed disturbance of emotions and conduct Active Problems:   Severe recurrent major depression without psychotic features (HCC)   Cocaine abuse (HCC)   Past Psychiatric History: Patient has a history of depression and substance abuse problems recent major stresses from child custody issues  Past Medical History:  Past Medical History:  Diagnosis Date  . Asthma   . Depression   . Foot fracture   . GERD (gastroesophageal reflux disease)     Past Surgical History:  Procedure Laterality Date  . LAPAROSCOPIC TUBAL LIGATION    . MULTIPLE TOOTH EXTRACTIONS    . WISDOM TOOTH EXTRACTION     Family History: History reviewed. No pertinent family history. Family Psychiatric  History: None Social History:  Social History   Substance and Sexual Activity  Alcohol Use No     Social History   Substance and Sexual Activity  Drug Use No    Social History   Socioeconomic History  . Marital status: Single    Spouse name: Not on file  . Number of children: Not on file  . Years of education: Not on file  . Highest education level: Not on file  Occupational History  . Not on file  Social Needs  . Financial resource strain: Not on file  . Food insecurity:    Worry: Not on file    Inability: Not on file  . Transportation needs:     Medical: Not on file    Non-medical: Not on file  Tobacco Use  . Smoking status: Current Every Day Smoker    Packs/day: 0.04    Years: 8.00    Pack years: 0.32    Types: Cigarettes  . Smokeless tobacco: Never Used  Substance and Sexual Activity  . Alcohol use: No  . Drug use: No  . Sexual activity: Not on file  Lifestyle  . Physical activity:    Days per week: Not on file    Minutes per session: Not on file  . Stress: Not on file  Relationships  . Social connections:    Talks on phone: Not on file    Gets together: Not on file    Attends religious service: Not on file    Active member of club or organization: Not on file    Attends meetings of clubs or organizations: Not on file    Relationship status: Not on file  Other Topics Concern  . Not on file  Social History Narrative  . Not on file    Hospital Course: Patient admitted to the psychiatric unit.  15-minute checks in place.  Patient was cooperative with treatment on the unit.  Agreed to restarting antidepressant medicine.  Did not display any dangerous or aggressive behavior.  Consistently denied suicidal ideation.  Patient was included in individual groups and therapy.  Showed good cooperation and insight and agreed outpatient treatment back in  the community.  Discharged on current medicine  Physical Findings: AIMS:  , ,  ,  ,    CIWA:    COWS:     Musculoskeletal: Strength & Muscle Tone: within normal limits Gait & Station: normal Patient leans: N/A  Psychiatric Specialty Exam: Physical Exam  Nursing note and vitals reviewed. Constitutional: She appears well-developed and well-nourished.  HENT:  Head: Normocephalic and atraumatic.  Eyes: Pupils are equal, round, and reactive to light. Conjunctivae are normal.  Neck: Normal range of motion.  Cardiovascular: Regular rhythm and normal heart sounds.  Respiratory: Effort normal. No respiratory distress.  GI: Soft.  Musculoskeletal: Normal range of motion.   Neurological: She is alert.  Skin: Skin is warm and dry.  Psychiatric: She has a normal mood and affect. Her behavior is normal. Judgment and thought content normal.    Review of Systems  Constitutional: Negative.   HENT: Negative.   Eyes: Negative.   Respiratory: Negative.   Cardiovascular: Negative.   Gastrointestinal: Negative.   Musculoskeletal: Negative.   Skin: Negative.   Neurological: Negative.   Psychiatric/Behavioral: Negative.     Blood pressure 115/70, pulse 80, temperature 98.5 F (36.9 C), temperature source Oral, resp. rate 18, height 5\' 2"  (1.575 m), weight 73.5 kg, last menstrual period 06/24/2018, SpO2 100 %.Body mass index is 29.63 kg/m.  General Appearance: Casual  Eye Contact:  Good  Speech:  Clear and Coherent  Volume:  Normal  Mood:  Euthymic  Affect:  Congruent  Thought Process:  Goal Directed  Orientation:  Full (Time, Place, and Person)  Thought Content:  Logical  Suicidal Thoughts:  No  Homicidal Thoughts:  No  Memory:  Immediate;   Fair Recent;   Fair Remote;   Fair  Judgement:  Fair  Insight:  Fair  Psychomotor Activity:  Decreased  Concentration:  Concentration: Fair  Recall:  Fair  Fund of Knowledge:  Fair  Language:  Fair  Akathisia:  No  Handed:  Right  AIMS (if indicated):     Assets:  Desire for Improvement Housing Physical Health Resilience  ADL's:  Intact  Cognition:  WNL  Sleep:  Number of Hours: 7     Have you used any form of tobacco in the last 30 days? (Cigarettes, Smokeless Tobacco, Cigars, and/or Pipes): Yes  Has this patient used any form of tobacco in the last 30 days? (Cigarettes, Smokeless Tobacco, Cigars, and/or Pipes) Yes, Yes, A prescription for an FDA-approved tobacco cessation medication was offered at discharge and the patient refused  Blood Alcohol level:  Lab Results  Component Value Date   ETH <10 07/20/2018   [facility]  04/12/2010    <5        LOWEST DETECTABLE LIMIT FOR SERUM ALCOHOL IS 5 mg/dL FOR  MEDICAL PURPOSES ONLY    Metabolic Disorder Labs:  Lab Results  Component Value Date   HGBA1C 5.3 07/22/2018   MPG 105 07/22/2018   MPG 100 07/21/2018   No results found for: PROLACTIN Lab Results  Component Value Date   CHOL 143 07/22/2018   TRIG 131 07/22/2018   HDL 30 (L) 07/22/2018   CHOLHDL 4.8 07/22/2018   VLDL 26 07/22/2018   LDLCALC 87 07/22/2018   LDLCALC 84 07/21/2018    See Psychiatric Specialty Exam and Suicide Risk Assessment completed by Attending Physician prior to discharge.  Discharge destination:  Home  Is patient on multiple antipsychotic therapies at discharge:  No   Has Patient had three or more failed trials  of antipsychotic monotherapy by history:  No  Recommended Plan for Multiple Antipsychotic Therapies: NA  Discharge Instructions    Diet - low sodium heart healthy   Complete by:  As directed    Increase activity slowly   Complete by:  As directed      Allergies as of 07/24/2018      Reactions   Ibuprofen Nausea Only   Tramadol Nausea Only      Medication List    STOP taking these medications   acetaminophen-codeine 300-30 MG tablet Commonly known as:  TYLENOL #3   amoxicillin 500 MG capsule Commonly known as:  AMOXIL   benazepril 5 MG tablet Commonly known as:  LOTENSIN   cephALEXin 500 MG capsule Commonly known as:  KEFLEX   HYDROcodone-acetaminophen 5-325 MG tablet Commonly known as:  NORCO/VICODIN   methocarbamol 500 MG tablet Commonly known as:  ROBAXIN   naproxen 500 MG tablet Commonly known as:  NAPROSYN   omeprazole 20 MG tablet Commonly known as:  PRILOSEC OTC   ondansetron 4 MG tablet Commonly known as:  ZOFRAN     TAKE these medications     Indication  albuterol 108 (90 Base) MCG/ACT inhaler Commonly known as:  PROVENTIL HFA;VENTOLIN HFA Inhale 2 puffs into the lungs every 6 (six) hours as needed for wheezing or shortness of breath. What changed:  Another medication with the same name was removed.  Continue taking this medication, and follow the directions you see here.  Indication:  Asthma   nitrofurantoin (macrocrystal-monohydrate) 100 MG capsule Commonly known as:  MACROBID Take 1 capsule (100 mg total) by mouth every 12 (twelve) hours.  Indication:  Urinary Tract Infection   sertraline 50 MG tablet Commonly known as:  ZOLOFT Take 1 tablet (50 mg total) by mouth daily.  Indication:  Major Depressive Disorder   traZODone 50 MG tablet Commonly known as:  DESYREL Take 1 tablet (50 mg total) by mouth at bedtime as needed for sleep.  Indication:  Trouble Sleeping      Follow-up Information    [facility] Follow up on 07/29/2018.   Why:  Please attend appointment on 2/26 at 9AM. Please bring ID and SS card. Contact information: [address] [place] [id number] [phone]           Follow-up recommendations:  Activity:  Activity as tolerated Diet:  Regular diet Other:  [place] [facility] [place].  Continue current medicine.  Comments: Supplies of medication and prescriptions were provided at discharge.  Signed: Mordecai RasmussenJohn Clapacs, MD 07/24/2018, 4:53 PM

## 2018-07-28 ENCOUNTER — Emergency Department (HOSPITAL_COMMUNITY): Payer: Self-pay

## 2018-07-28 ENCOUNTER — Emergency Department (HOSPITAL_COMMUNITY)
Admission: EM | Admit: 2018-07-28 | Discharge: 2018-07-28 | Disposition: A | Discharge status: Home / Self Care | Payer: Self-pay | Attending: Emergency Medicine | Admitting: Emergency Medicine

## 2018-07-28 ENCOUNTER — Encounter (HOSPITAL_COMMUNITY): Payer: Self-pay | Admitting: Emergency Medicine

## 2018-07-28 ENCOUNTER — Other Ambulatory Visit: Payer: Self-pay

## 2018-07-28 DIAGNOSIS — F1410 Cocaine abuse, uncomplicated: Secondary | ICD-10-CM | POA: Insufficient documentation

## 2018-07-28 DIAGNOSIS — J45909 Unspecified asthma, uncomplicated: Secondary | ICD-10-CM | POA: Insufficient documentation

## 2018-07-28 DIAGNOSIS — F17210 Nicotine dependence, cigarettes, uncomplicated: Secondary | ICD-10-CM | POA: Insufficient documentation

## 2018-07-28 DIAGNOSIS — F329 Major depressive disorder, single episode, unspecified: Secondary | ICD-10-CM | POA: Insufficient documentation

## 2018-07-28 DIAGNOSIS — R0789 Other chest pain: Secondary | ICD-10-CM | POA: Insufficient documentation

## 2018-07-28 DIAGNOSIS — R079 Chest pain, unspecified: Secondary | ICD-10-CM

## 2018-07-28 DIAGNOSIS — Z79899 Other long term (current) drug therapy: Secondary | ICD-10-CM | POA: Insufficient documentation

## 2018-07-28 LAB — CBC WITH DIFFERENTIAL/PLATELET
Abs Immature Granulocytes: 0.02 10*3/uL (ref 0.00–0.07)
Basophils Absolute: 0.1 10*3/uL (ref 0.0–0.1)
Basophils Relative: 0 %
Eosinophils Absolute: 0 10*3/uL (ref 0.0–0.5)
Eosinophils Relative: 0 %
HCT: 41.4 % (ref 36.0–46.0)
Hemoglobin: 13 g/dL (ref 12.0–15.0)
IMMATURE GRANULOCYTES: 0 %
Lymphocytes Relative: 18 %
Lymphs Abs: 2.3 10*3/uL (ref 0.7–4.0)
MCH: 26.5 pg (ref 26.0–34.0)
MCHC: 31.4 g/dL (ref 30.0–36.0)
MCV: 84.5 fL (ref 80.0–100.0)
MONOS PCT: 4 %
Monocytes Absolute: 0.6 10*3/uL (ref 0.1–1.0)
Neutro Abs: 10.3 10*3/uL — ABNORMAL HIGH (ref 1.7–7.7)
Neutrophils Relative %: 78 %
PLATELETS: 258 10*3/uL (ref 150–400)
RBC: 4.9 MIL/uL (ref 3.87–5.11)
RDW: 13.6 % (ref 11.5–15.5)
WBC: 13.3 10*3/uL — AB (ref 4.0–10.5)
nRBC: 0 % (ref 0.0–0.2)

## 2018-07-28 LAB — RAPID URINE DRUG SCREEN, HOSP PERFORMED
Amphetamines: NOT DETECTED
Barbiturates: NOT DETECTED
Benzodiazepines: NOT DETECTED
Cocaine: POSITIVE — AB
Opiates: NOT DETECTED
Tetrahydrocannabinol: NOT DETECTED

## 2018-07-28 LAB — COMPREHENSIVE METABOLIC PANEL
ALT: 12 U/L (ref 0–44)
AST: 14 U/L — ABNORMAL LOW (ref 15–41)
Albumin: 4.2 g/dL (ref 3.5–5.0)
Alkaline Phosphatase: 63 U/L (ref 38–126)
Anion gap: 9 (ref 5–15)
BUN: 8 mg/dL (ref 6–20)
CO2: 21 mmol/L — ABNORMAL LOW (ref 22–32)
Calcium: 8.7 mg/dL — ABNORMAL LOW (ref 8.9–10.3)
Chloride: 106 mmol/L (ref 98–111)
Creatinine, Ser: 0.89 mg/dL (ref 0.44–1.00)
GFR calc Af Amer: >60 mL/min (ref >60)
GFR calc non Af Amer: >60 mL/min (ref >60)
Glucose, Bld: 95 mg/dL (ref 70–99)
Potassium: 3.7 mmol/L (ref 3.5–5.1)
Sodium: 136 mmol/L (ref 135–145)
Total Bilirubin: 0.6 mg/dL (ref 0.3–1.2)
Total Protein: 7.2 g/dL (ref 6.5–8.1)

## 2018-07-28 LAB — TROPONIN I

## 2018-07-28 NOTE — ED Provider Notes (Addendum)
[facility] EMERGENCY DEPARTMENT Provider Note   CSN: [id number] Arrival date & time: 07/28/18  0011    History   Chief Complaint Chief Complaint  Patient presents with  . Chest Pain    HPI Pamela Sanford is a 38 y.o. female.     The history is provided by the patient and a friend. No language interpreter was used.  Chest Pain  Pain location:  L chest Pain quality: aching   Pain radiates to:  L shoulder and L arm Pain severity:  Moderate Onset quality:  Gradual Duration:  1 day Timing:  Constant Progression:  Worsening Chronicity:  New Context: at rest   Relieved by:  Nothing Ineffective treatments:  None tried Associated symptoms: no abdominal pain and no shortness of breath   Risk factors: smoking     Past Medical History:  Diagnosis Date  . Asthma   . Depression   . Foot fracture   . GERD (gastroesophageal reflux disease)     Patient Active Problem List   Diagnosis Date Noted  . 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 Cocaine abuse (HCC) 07/22/2018  . Severe recurrent major depression without psychotic features (HCC) 07/21/2018    Past Surgical History:  Procedure Laterality Date  . LAPAROSCOPIC TUBAL LIGATION    . MULTIPLE TOOTH EXTRACTIONS    . WISDOM TOOTH EXTRACTION       OB History    Gravida  2   Para  2   Term  2   Preterm      AB      Living  2     SAB      TAB      Ectopic      Multiple      Live Births               Home Medications    Prior to Admission medications   Medication Sig Start Date End Date Taking? Authorizing Provider  albuterol (PROVENTIL HFA;VENTOLIN HFA) 108 (90 Base) MCG/ACT inhaler Inhale 2 puffs into the lungs every 6 (six) hours as needed for wheezing or shortness of breath. 07/23/18  Yes Clapacs, Jackquline Denmark, MD  nitrofurantoin, macrocrystal-monohydrate, (MACROBID) 100 MG capsule Take 1 capsule (100 mg total) by mouth every 12 (twelve) hours. 07/23/18   Yes Clapacs, Jackquline Denmark, MD  sertraline (ZOLOFT) 50 MG tablet Take 1 tablet (50 mg total) by mouth daily. 07/24/18  Yes Clapacs, Jackquline Denmark, MD  traZODone (DESYREL) 50 MG tablet Take 1 tablet (50 mg total) by mouth at bedtime as needed for sleep. 07/23/18  Yes Clapacs, Jackquline Denmark, MD    Family History No family history on file.  Social History Social History   Tobacco Use  . Smoking status: Current Every Day Smoker    Packs/day: 0.04    Years: 8.00    Pack years: 0.32    Types: Cigarettes  . Smokeless tobacco: Never Used  Substance Use Topics  . Alcohol use: No  . Drug use: No     Allergies   Ibuprofen and Tramadol   Review of Systems Review of Systems  Respiratory: Negative for shortness of breath.   Cardiovascular: Positive for chest pain.  Gastrointestinal: Negative for abdominal pain.  All other systems reviewed and are negative.    Physical Exam Updated Vital Signs BP 127/87 (BP Location: Left Arm)   Pulse (!) 116   Temp 98.6 F (37 C) (Oral)   Resp Marland Kitchen)  25   Ht 5\' 2"  (1.575 m)   Wt 73.9 kg   LMP 06/17/2018   SpO2 98%   BMI 29.80 kg/m   Physical Exam Vitals signs and nursing note reviewed.  Constitutional:      Appearance: She is well-developed.  HENT:     Head: Normocephalic.  Neck:     Musculoskeletal: Normal range of motion.  Cardiovascular:     Rate and Rhythm: Normal rate.     Heart sounds: Normal heart sounds.  Pulmonary:     Effort: Pulmonary effort is normal.     Breath sounds: Normal breath sounds.  Abdominal:     General: Bowel sounds are normal. There is no distension.     Palpations: Abdomen is soft.  Musculoskeletal: Normal range of motion.  Skin:    General: Skin is warm.  Neurological:     Mental Status: She is alert and oriented to person, place, and time.      ED Treatments / Results  Labs (all labs ordered are listed, but only abnormal results are displayed) Labs Reviewed  CBC WITH DIFFERENTIAL/PLATELET  COMPREHENSIVE METABOLIC  PANEL  RAPID URINE DRUG SCREEN, HOSP PERFORMED  TROPONIN I    EKG EKG Interpretation  Date/Time:  Tuesday July 28 2018 00:24:02 EST Ventricular Rate:  108 PR Interval:    QRS Duration: 90 QT Interval:  335 QTC Calculation: 449 R Axis:   73 Text Interpretation:  Sinus tachycardia Borderline T abnormalities, diffuse leads Baseline wander in lead(s) V1 V3 No old tracing to compare Confirmed by Devoria Albe ([id number]) on 07/28/2018 12:34:03 AM   Radiology No results found.  Procedures Procedures (including critical care time)  Medications Ordered in ED Medications - No data to display   Initial Impression / Assessment and Plan / ED Course  I have reviewed the triage vital signs and the nursing notes.  Pertinent labs & imaging results that were available during my care of the patient were reviewed by me and considered in my medical decision making (see chart for details).        MDM   Pt tachycardic.  Labs and chest xray ordered.   Pt's care turned over to Dr. Catalina Gravel.   Final Clinical Impressions(s) / ED Diagnoses   Final diagnoses:  None    ED Discharge Orders    None       Elson Areas, PA-C 07/28/18 0037    Elson Areas, PA-C 07/28/18 0118    Devoria Albe, MD 07/28/18 [phone]

## 2018-07-28 NOTE — ED Triage Notes (Signed)
Pt with c/o chest pain that she states is worse with movement and that radiates to her L arm.

## 2018-07-28 NOTE — Discharge Instructions (Addendum)
Use ice and heat for comfort.  Take ibuprofen 600 mg 4 times a day for pain.  You need to get help for your cocaine abuse.  Look at the resource guide.

## 2018-08-06 ENCOUNTER — Encounter (HOSPITAL_COMMUNITY): Payer: Self-pay | Admitting: Emergency Medicine

## 2018-08-06 ENCOUNTER — Other Ambulatory Visit: Payer: Self-pay

## 2018-08-06 ENCOUNTER — Emergency Department (HOSPITAL_COMMUNITY)
Admission: EM | Admit: 2018-08-06 | Discharge: 2018-08-06 | Disposition: A | Discharge status: Home / Self Care | Payer: Medicaid Other | Attending: Emergency Medicine | Admitting: Emergency Medicine

## 2018-08-06 ENCOUNTER — Emergency Department (HOSPITAL_COMMUNITY): Payer: Medicaid Other

## 2018-08-06 DIAGNOSIS — R0981 Nasal congestion: Secondary | ICD-10-CM | POA: Insufficient documentation

## 2018-08-06 DIAGNOSIS — R05 Cough: Secondary | ICD-10-CM | POA: Insufficient documentation

## 2018-08-06 DIAGNOSIS — R200 Anesthesia of skin: Secondary | ICD-10-CM | POA: Insufficient documentation

## 2018-08-06 NOTE — ED Triage Notes (Signed)
Pt c/o persistent productive cough and nasal drainage x 2 weeks. Pt also reports bilateral intermittent lower extremity numbness x 3-4 days. Pt ambulatory without difficulty.

## 2018-08-06 NOTE — ED Notes (Signed)
Per registration pt LWBS at 1819

## 2018-08-09 ENCOUNTER — Other Ambulatory Visit: Payer: Self-pay

## 2018-08-09 ENCOUNTER — Encounter (HOSPITAL_COMMUNITY): Payer: Self-pay | Admitting: Emergency Medicine

## 2018-08-09 ENCOUNTER — Emergency Department (HOSPITAL_COMMUNITY)
Admission: EM | Admit: 2018-08-09 | Discharge: 2018-08-09 | Disposition: A | Discharge status: Home / Self Care | Payer: Self-pay | Attending: Emergency Medicine | Admitting: Emergency Medicine

## 2018-08-09 ENCOUNTER — Emergency Department (HOSPITAL_COMMUNITY): Payer: Self-pay

## 2018-08-09 DIAGNOSIS — Z79899 Other long term (current) drug therapy: Secondary | ICD-10-CM | POA: Insufficient documentation

## 2018-08-09 DIAGNOSIS — J3489 Other specified disorders of nose and nasal sinuses: Secondary | ICD-10-CM | POA: Insufficient documentation

## 2018-08-09 DIAGNOSIS — F17210 Nicotine dependence, cigarettes, uncomplicated: Secondary | ICD-10-CM | POA: Insufficient documentation

## 2018-08-09 DIAGNOSIS — R05 Cough: Secondary | ICD-10-CM | POA: Insufficient documentation

## 2018-08-09 DIAGNOSIS — J45909 Unspecified asthma, uncomplicated: Secondary | ICD-10-CM | POA: Insufficient documentation

## 2018-08-09 DIAGNOSIS — F1410 Cocaine abuse, uncomplicated: Secondary | ICD-10-CM | POA: Insufficient documentation

## 2018-08-09 DIAGNOSIS — T50901A Poisoning by unspecified drugs, medicaments and biological substances, accidental (unintentional), initial encounter: Secondary | ICD-10-CM | POA: Insufficient documentation

## 2018-08-09 HISTORY — DX: Headache, unspecified: R51.9

## 2018-08-09 HISTORY — DX: Headache: R51

## 2018-08-09 HISTORY — DX: Cocaine abuse, uncomplicated: F14.10

## 2018-08-09 LAB — COMPREHENSIVE METABOLIC PANEL
ALT: 12 U/L (ref 0–44)
AST: 12 U/L — ABNORMAL LOW (ref 15–41)
Albumin: 4 g/dL (ref 3.5–5.0)
Alkaline Phosphatase: 64 U/L (ref 38–126)
Anion gap: 6 (ref 5–15)
BUN: 9 mg/dL (ref 6–20)
CO2: 24 mmol/L (ref 22–32)
CREATININE: 0.81 mg/dL (ref 0.44–1.00)
Calcium: 8.6 mg/dL — ABNORMAL LOW (ref 8.9–10.3)
Chloride: 113 mmol/L — ABNORMAL HIGH (ref 98–111)
GFR calc Af Amer: >60 mL/min (ref >60)
GFR calc non Af Amer: >60 mL/min (ref >60)
Glucose, Bld: 99 mg/dL (ref 70–99)
Potassium: 3.3 mmol/L — ABNORMAL LOW (ref 3.5–5.1)
Sodium: 143 mmol/L (ref 135–145)
Total Bilirubin: 0.2 mg/dL — ABNORMAL LOW (ref 0.3–1.2)
Total Protein: 6.8 g/dL (ref 6.5–8.1)

## 2018-08-09 LAB — CBC WITH DIFFERENTIAL/PLATELET
Abs Immature Granulocytes: 0.04 10*3/uL (ref 0.00–0.07)
Basophils Absolute: 0 10*3/uL (ref 0.0–0.1)
Basophils Relative: 0 %
Eosinophils Absolute: 0.1 10*3/uL (ref 0.0–0.5)
Eosinophils Relative: 1 %
HCT: 39.6 % (ref 36.0–46.0)
Hemoglobin: 12.4 g/dL (ref 12.0–15.0)
IMMATURE GRANULOCYTES: 0 %
LYMPHS ABS: 2.5 10*3/uL (ref 0.7–4.0)
Lymphocytes Relative: 20 %
MCH: 26.5 pg (ref 26.0–34.0)
MCHC: 31.3 g/dL (ref 30.0–36.0)
MCV: 84.6 fL (ref 80.0–100.0)
Monocytes Absolute: 0.7 10*3/uL (ref 0.1–1.0)
Monocytes Relative: 5 %
Neutro Abs: 9.4 10*3/uL — ABNORMAL HIGH (ref 1.7–7.7)
Neutrophils Relative %: 74 %
Platelets: 242 10*3/uL (ref 150–400)
RBC: 4.68 MIL/uL (ref 3.87–5.11)
RDW: 13.8 % (ref 11.5–15.5)
WBC: 12.7 10*3/uL — ABNORMAL HIGH (ref 4.0–10.5)
nRBC: 0 % (ref 0.0–0.2)

## 2018-08-09 LAB — RAPID URINE DRUG SCREEN, HOSP PERFORMED
Amphetamines: NOT DETECTED
Barbiturates: NOT DETECTED
Benzodiazepines: NOT DETECTED
Cocaine: POSITIVE — AB
Opiates: NOT DETECTED
Tetrahydrocannabinol: NOT DETECTED

## 2018-08-09 LAB — URINALYSIS, ROUTINE W REFLEX MICROSCOPIC
Bacteria, UA: NONE SEEN
Bilirubin Urine: NEGATIVE
GLUCOSE, UA: NEGATIVE mg/dL
Ketones, ur: NEGATIVE mg/dL
Nitrite: NEGATIVE
PH: 5 (ref 5.0–8.0)
Protein, ur: NEGATIVE mg/dL
Specific Gravity, Urine: 1.026 (ref 1.005–1.030)

## 2018-08-09 LAB — PROTIME-INR
INR: 0.9 (ref 0.8–1.2)
Prothrombin Time: 12.5 seconds (ref 11.4–15.2)

## 2018-08-09 LAB — SALICYLATE LEVEL: Salicylate Lvl: <7 mg/dL (ref 2.8–30.0)

## 2018-08-09 LAB — ETHANOL: Alcohol, Ethyl (B): <10 mg/dL (ref <10)

## 2018-08-09 LAB — PREGNANCY, URINE: Preg Test, Ur: NEGATIVE

## 2018-08-09 LAB — ACETAMINOPHEN LEVEL: Acetaminophen (Tylenol), Serum: <10 ug/mL — ABNORMAL LOW (ref 10–30)

## 2018-08-09 LAB — LIPASE, BLOOD: LIPASE: 47 U/L (ref 11–51)

## 2018-08-09 LAB — TROPONIN I: Troponin I: <0.03 ng/mL (ref <0.03)

## 2018-08-09 MED ORDER — POTASSIUM CHLORIDE CRYS ER 20 MEQ PO TBCR
40.0000 meq | EXTENDED_RELEASE_TABLET | Freq: Once | ORAL | Status: AC
  Administered 2018-08-09: 40 meq via ORAL
  Filled 2018-08-09: qty 2

## 2018-08-09 MED ORDER — SODIUM CHLORIDE 0.9 % IV BOLUS
1000.0000 mL | Freq: Once | INTRAVENOUS | Status: AC
  Administered 2018-08-09: 1000 mL via INTRAVENOUS

## 2018-08-09 MED ORDER — ACETAMINOPHEN 325 MG PO TABS
650.0000 mg | ORAL_TABLET | Freq: Once | ORAL | Status: AC
  Administered 2018-08-09: 650 mg via ORAL
  Filled 2018-08-09: qty 2

## 2018-08-09 NOTE — Discharge Instructions (Signed)
Take your usual prescriptions as previously directed.  Increase your fluid intake (ie:  Gatoraide) for the next few days.  Eat a bland diet. Do not take any pills if you do not know what they are.  Call your regular medical doctor Monday to schedule a follow up appointment within the next week.  Return to the Emergency Department immediately sooner if worsening.

## 2018-08-09 NOTE — ED Notes (Signed)
Pt ambulated to BR without complaints

## 2018-08-09 NOTE — ED Triage Notes (Signed)
Patient c/o dizziness, dry mouth, and nausea that started approx 1 hour ago. Per patient started after taking medication give to her by a friend for headache. Patient states "feels like the room is spinning." Per patient worse with movement. Denies any weakness, slurred speech, or facial drooping. Patient unsure of what medication she took. Patient also reports cough and chills prior x2 weeks. Unsure of fevers.

## 2018-08-09 NOTE — ED Provider Notes (Signed)
[facility] EMERGENCY DEPARTMENT Provider Note   CSN: [id number] Arrival date & time: 08/09/18  1214    History   Chief Complaint Chief Complaint  Patient presents with  . Dizziness    HPI Pamela Sanford is a 38 y.o. female.     HPI  Pt was seen at 1240. Per pt and her friend: c/o gradual onset and persistence of constant "dizziness" that began after she took an unknown medication approximately 9am this morning. Pt states she had one of her "headaches" and her friend gave her "a headache pill." Pt does not know what it was, and cannot call her friend because her friend just went to jail. Pt states approximately 30 minutes after taking this unknown pill, she began to feel nausea, "dry mouth," and "dizzy." "Dizziness" worsens with movement. Pt also states she has been "coughing" with runny/stuffy nose for the past 2 weeks. Denies further headache. Describes the previous headache as per her usual chronic headache pain pattern.  Denies headache was sudden or maximal in onset or at any time.  Denies visual changes, no focal motor weakness, no tingling/numbness in extremities, no fevers, no neck pain, no rash, no CP/palpitations, no SOB/cough, no abd pain, no vomiting/diarrhea.      Past Medical History:  Diagnosis Date  . Asthma   . Cocaine abuse (HCC)   . Depression   . Foot fracture   . GERD (gastroesophageal reflux disease)   . Headache     Patient Active Problem List   Diagnosis Date Noted  . 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 Cocaine abuse (HCC) 07/22/2018  . Severe recurrent major depression without psychotic features (HCC) 07/21/2018    Past Surgical History:  Procedure Laterality Date  . LAPAROSCOPIC TUBAL LIGATION    . MULTIPLE TOOTH EXTRACTIONS    . WISDOM TOOTH EXTRACTION       OB History    Gravida  2   Para  2   Term  2   Preterm      AB      Living  2     SAB      TAB      Ectopic      Multiple      Live Births               Home Medications    Prior to Admission medications   Medication Sig Start Date End Date Taking? Authorizing Provider  albuterol (PROVENTIL HFA;VENTOLIN HFA) 108 (90 Base) MCG/ACT inhaler Inhale 2 puffs into the lungs every 6 (six) hours as needed for wheezing or shortness of breath. 07/23/18   Clapacs, Jackquline Denmark, MD  nitrofurantoin, macrocrystal-monohydrate, (MACROBID) 100 MG capsule Take 1 capsule (100 mg total) by mouth every 12 (twelve) hours. 07/23/18   Clapacs, Jackquline Denmark, MD  sertraline (ZOLOFT) 50 MG tablet Take 1 tablet (50 mg total) by mouth daily. 07/24/18   Clapacs, Jackquline Denmark, MD  traZODone (DESYREL) 50 MG tablet Take 1 tablet (50 mg total) by mouth at bedtime as needed for sleep. 07/23/18   Clapacs, Jackquline Denmark, MD    Family History History reviewed. No pertinent family history.  Social History Social History   Tobacco Use  . Smoking status: Current Every Day Smoker    Packs/day: 0.04    Years: 8.00    Pack years: 0.32    Types: Cigarettes  . Smokeless tobacco: Never Used  Substance Use Topics  . Alcohol use:  No  . Drug use: No     Allergies   Ibuprofen and Tramadol   Review of Systems Review of Systems ROS: Statement: All systems negative except as marked or noted in the HPI; Constitutional: Negative for fever and chills. ; ; Eyes: Negative for eye pain, redness and discharge. ; ; ENMT: Negative for ear pain, hoarseness, nasal congestion, sinus pressure and sore throat. ; ; Cardiovascular: Negative for chest pain, palpitations, diaphoresis, dyspnea and peripheral edema. ; ; Respiratory: +cough. Negative for wheezing and stridor. ; ; Gastrointestinal: +nausea. Negative for vomiting, diarrhea, abdominal pain, blood in stool, hematemesis, jaundice and rectal bleeding. . ; ; Genitourinary: Negative for dysuria, flank pain and hematuria. ; ; Musculoskeletal: Negative for back pain and neck pain. Negative for swelling and trauma.; ; Skin:  Negative for pruritus, rash, abrasions, blisters, bruising and skin lesion.; ; Neuro: +"dizziness." Negative for headache, lightheadedness and neck stiffness. Negative for weakness, altered level of consciousness, altered mental status, extremity weakness, paresthesias, involuntary movement, seizure and syncope.       Physical Exam Updated Vital Signs BP 139/89 (BP Location: Left Arm)   Pulse 92   Temp 98.2 F (36.8 C) (Oral)   Resp 16   Ht 5\' 2"  (1.575 m)   Wt 72.6 kg   LMP  (LMP Unknown) Comment: states LMP was about 2 months ago  SpO2 97%   BMI 29.26 kg/m    13:10:27 Orthostatic Vital Signs HC  Orthostatic Lying   BP- Lying: 121/79  Pulse- Lying: 90      Orthostatic Sitting  BP- Sitting: 107/76  Pulse- Sitting: 85      Orthostatic Standing at 0 minutes  BP- Standing at 0 minutes: 116/75  Pulse- Standing at 0 minutes: 93    Physical Exam 1245: Physical examination:  Nursing notes reviewed; Vital signs and O2 SAT reviewed;  Constitutional: Well developed, Well nourished, Well hydrated, In no acute distress; Head:  Normocephalic, atraumatic; Eyes: EOMI, PERRL, No scleral icterus; ENMT: Mouth and pharynx normal, Mucous membranes moist; Neck: Supple, Full range of motion, No lymphadenopathy; Cardiovascular: Regular rate and rhythm, No gallop; Respiratory: Breath sounds clear & equal bilaterally, No wheezes.  Speaking full sentences with ease, Normal respiratory effort/excursion; Chest: Nontender, Movement normal; Abdomen: Soft, Nontender, Nondistended, Normal bowel sounds; Genitourinary: No CVA tenderness; Extremities: Peripheral pulses normal, No tenderness, No edema, No calf edema or asymmetry.; Neuro: AA&Ox3, Major CN grossly intact. No facial droop. Speech clear. No gross focal motor or sensory deficits in extremities.; Skin: Color normal, Warm, Dry.   ED Treatments / Results  Labs (all labs ordered are listed, but only abnormal results are displayed)   EKG EKG  Interpretation  Date/Time:  Sunday August 09 2018 13:02:40 EDT Ventricular Rate:  90 PR Interval:    QRS Duration: 99 QT Interval:  376 QTC Calculation: 461 R Axis:   89 Text Interpretation:  Sinus arrhythmia Ventricular premature complex RSR' in V1 or V2, probably normal variant Borderline T wave abnormalities Artifact When compared with ECG of 07/28/2018 No significant change was found Confirmed by Samuel Jester [phone]) on 08/09/2018 1:44:14 PM   Radiology   Procedures Procedures (including critical care time)  Medications Ordered in ED Medications  sodium chloride 0.9 % bolus 1,000 mL (has no administration in time range)     Initial Impression / Assessment and Plan / ED Course  I have reviewed the triage vital signs and the nursing notes.  Pertinent labs & imaging results that were available during  my care of the patient were reviewed by me and considered in my medical decision making (see chart for details).     MDM Reviewed: previous chart, nursing note and vitals Reviewed previous: labs and ECG Interpretation: labs, ECG, x-ray and CT scan   Results for orders placed or performed during the hospital encounter of 08/09/18  Urine rapid drug screen (hosp performed)  Result Value Ref Range   Opiates NONE DETECTED NONE DETECTED   Cocaine POSITIVE (A) NONE DETECTED   Benzodiazepines NONE DETECTED NONE DETECTED   Amphetamines NONE DETECTED NONE DETECTED   Tetrahydrocannabinol NONE DETECTED NONE DETECTED   Barbiturates NONE DETECTED NONE DETECTED  Urinalysis, Routine w reflex microscopic  Result Value Ref Range   Color, Urine YELLOW YELLOW   APPearance CLOUDY (A) CLEAR   Specific Gravity, Urine 1.026 1.005 - 1.030   pH 5.0 5.0 - 8.0   Glucose, UA NEGATIVE NEGATIVE mg/dL   Hgb urine dipstick SMALL (A) NEGATIVE   Bilirubin Urine NEGATIVE NEGATIVE   Ketones, ur NEGATIVE NEGATIVE mg/dL   Protein, ur NEGATIVE NEGATIVE mg/dL   Nitrite NEGATIVE NEGATIVE    Leukocytes,Ua TRACE (A) NEGATIVE   RBC / HPF 0-5 0 - 5 RBC/hpf   WBC, UA 11-20 0 - 5 WBC/hpf   Bacteria, UA NONE SEEN NONE SEEN   Squamous Epithelial / LPF 11-20 0 - 5   Mucus PRESENT   Pregnancy, urine  Result Value Ref Range   Preg Test, Ur NEGATIVE NEGATIVE  Acetaminophen level  Result Value Ref Range   Acetaminophen (Tylenol), Serum <10 (L) 10 - 30 ug/mL  Comprehensive metabolic panel  Result Value Ref Range   Sodium 143 135 - 145 mmol/L   Potassium 3.3 (L) 3.5 - 5.1 mmol/L   Chloride 113 (H) 98 - 111 mmol/L   CO2 24 22 - 32 mmol/L   Glucose, Bld 99 70 - 99 mg/dL   BUN 9 6 - 20 mg/dL   Creatinine, Ser 1.61 0.44 - 1.00 mg/dL   Calcium 8.6 (L) 8.9 - 10.3 mg/dL   Total Protein 6.8 6.5 - 8.1 g/dL   Albumin 4.0 3.5 - 5.0 g/dL   AST 12 (L) 15 - 41 U/L   ALT 12 0 - 44 U/L   Alkaline Phosphatase 64 38 - 126 U/L   Total Bilirubin 0.2 (L) 0.3 - 1.2 mg/dL   GFR calc non Af Amer >60 >60 mL/min   GFR calc Af Amer >60 >60 mL/min   Anion gap 6 5 - 15  Ethanol  Result Value Ref Range   Alcohol, Ethyl (B) <10 <10 mg/dL  Salicylate level  Result Value Ref Range   Salicylate Lvl <7.0 2.8 - 30.0 mg/dL  CBC with Differential  Result Value Ref Range   WBC 12.7 (H) 4.0 - 10.5 K/uL   RBC 4.68 3.87 - 5.11 MIL/uL   Hemoglobin 12.4 12.0 - 15.0 g/dL   HCT 09.6 04.5 - 40.9 %   MCV 84.6 80.0 - 100.0 fL   MCH 26.5 26.0 - 34.0 pg   MCHC 31.3 30.0 - 36.0 g/dL   RDW 81.1 91.4 - 78.2 %   Platelets 242 150 - 400 K/uL   nRBC 0.0 0.0 - 0.2 %   Neutrophils Relative % 74 %   Neutro Abs 9.4 (H) 1.7 - 7.7 K/uL   Lymphocytes Relative 20 %   Lymphs Abs 2.5 0.7 - 4.0 K/uL   Monocytes Relative 5 %   Monocytes Absolute 0.7 0.1 - 1.0  K/uL   Eosinophils Relative 1 %   Eosinophils Absolute 0.1 0.0 - 0.5 K/uL   Basophils Relative 0 %   Basophils Absolute 0.0 0.0 - 0.1 K/uL   Immature Granulocytes 0 %   Abs Immature Granulocytes 0.04 0.00 - 0.07 K/uL  Protime-INR  Result Value Ref Range   Prothrombin  Time 12.5 11.4 - 15.2 seconds   INR 0.9 0.8 - 1.2  Lipase, blood  Result Value Ref Range   Lipase 47 11 - 51 U/L  Troponin I - Once  Result Value Ref Range   Troponin I <0.03 <0.03 ng/mL   Dg Chest 2 View Result Date: 08/09/2018 CLINICAL DATA:  Nausea EXAM: CHEST - 2 VIEW COMPARISON:  Three days ago FINDINGS: Normal heart size and mediastinal contours. There is no edema, consolidation, effusion, or pneumothorax. No osseous findings IMPRESSION: Stable exam.  No evidence of active disease. Electronically Signed   By: Marnee Spring M.D.   On: 08/09/2018 14:10    Ct Head Wo Contrast Result Date: 08/09/2018 CLINICAL DATA:  Ingestion at 9 a.m. with subsequent dizzy feeling EXAM: CT HEAD WITHOUT CONTRAST TECHNIQUE: Contiguous axial images were obtained from the base of the skull through the vertex without intravenous contrast. COMPARISON:  None. FINDINGS: Brain: No evidence of acute infarction, hemorrhage, hydrocephalus, extra-axial collection or mass lesion/mass effect. Vascular: No hyperdense vessel or unexpected calcification. Skull: Normal. Negative for fracture or focal lesion. Sinuses/Orbits: Negative. IMPRESSION: Negative head CT. Electronically Signed   By: Marnee Spring M.D.   On: 08/09/2018 14:24      1250:  Pt has no idea what "pill" she took. Friend at bedside trying to encourage pt to remember what it looked like, but she cannot recall. Pt and friend aware that she will not be receiving any other medications while in the ED, as I cannot be certain there would not be a drug-drug interaction. Pt upset. I asked pt if she could call her friend, but this friend was arrested and placed in  jail shortly after the incident this morning. Will continue to monitor pt as workup progresses.   1635:  Potassium repleted PO. Udip appears contaminated. Pt has tol PO well while in the ED without N/V. Pt has ambulated with steady gait, NAD. VS remain stable, resps easy, abd benign, neuro exam intact and  non-focal. Pt has been 7 hours post ingestion of unknown substance and remains stable.  Feels better and wants to go home now. Pt cautioned regarding taking any pills that she does not know what they are; pt verb understanding. Dx and testing d/w pt and family.  Questions answered.  Verb understanding, agreeable to d/c home with outpt f/u.     Final Clinical Impressions(s) / ED Diagnoses   Final diagnoses:  None    ED Discharge Orders    None       Samuel Jester, DO 08/13/18 2220

## 2018-09-01 ENCOUNTER — Encounter (HOSPITAL_COMMUNITY): Payer: Self-pay | Admitting: Emergency Medicine

## 2018-09-01 ENCOUNTER — Emergency Department (HOSPITAL_COMMUNITY)
Admission: EM | Admit: 2018-09-01 | Discharge: 2018-09-02 | Disposition: A | Discharge status: Other Acute Inpatient Hospital | Payer: Medicaid Other | Attending: Emergency Medicine | Admitting: Emergency Medicine

## 2018-09-01 ENCOUNTER — Other Ambulatory Visit: Payer: Self-pay

## 2018-09-01 ENCOUNTER — Ambulatory Visit (HOSPITAL_COMMUNITY)
Admission: RE | Admit: 2018-09-01 | Discharge: 2018-09-01 | Disposition: A | Discharge status: Home / Self Care | Payer: No Typology Code available for payment source | Attending: Psychiatry | Admitting: Psychiatry

## 2018-09-01 DIAGNOSIS — R45851 Suicidal ideations: Secondary | ICD-10-CM | POA: Insufficient documentation

## 2018-09-01 DIAGNOSIS — J45909 Unspecified asthma, uncomplicated: Secondary | ICD-10-CM | POA: Insufficient documentation

## 2018-09-01 DIAGNOSIS — F329 Major depressive disorder, single episode, unspecified: Secondary | ICD-10-CM | POA: Insufficient documentation

## 2018-09-01 DIAGNOSIS — F332 Major depressive disorder, recurrent severe without psychotic features: Secondary | ICD-10-CM | POA: Insufficient documentation

## 2018-09-01 DIAGNOSIS — F13929 Sedative, hypnotic or anxiolytic use, unspecified with intoxication, unspecified: Secondary | ICD-10-CM | POA: Insufficient documentation

## 2018-09-01 DIAGNOSIS — F14929 Cocaine use, unspecified with intoxication, unspecified: Secondary | ICD-10-CM | POA: Insufficient documentation

## 2018-09-01 DIAGNOSIS — Z79899 Other long term (current) drug therapy: Secondary | ICD-10-CM | POA: Insufficient documentation

## 2018-09-01 DIAGNOSIS — F17210 Nicotine dependence, cigarettes, uncomplicated: Secondary | ICD-10-CM | POA: Insufficient documentation

## 2018-09-01 DIAGNOSIS — F1410 Cocaine abuse, uncomplicated: Secondary | ICD-10-CM | POA: Insufficient documentation

## 2018-09-01 DIAGNOSIS — Z886 Allergy status to analgesic agent status: Secondary | ICD-10-CM | POA: Insufficient documentation

## 2018-09-01 DIAGNOSIS — Z590 Homelessness: Secondary | ICD-10-CM | POA: Insufficient documentation

## 2018-09-01 LAB — COMPREHENSIVE METABOLIC PANEL
ALT: 11 U/L (ref 0–44)
AST: 15 U/L (ref 15–41)
Albumin: 3.7 g/dL (ref 3.5–5.0)
Alkaline Phosphatase: 59 U/L (ref 38–126)
Anion gap: 6 (ref 5–15)
BUN: 10 mg/dL (ref 6–20)
CO2: 25 mmol/L (ref 22–32)
Calcium: 8.6 mg/dL — ABNORMAL LOW (ref 8.9–10.3)
Chloride: 114 mmol/L — ABNORMAL HIGH (ref 98–111)
Creatinine, Ser: 0.93 mg/dL (ref 0.44–1.00)
GFR calc Af Amer: >60 mL/min (ref >60)
GFR calc non Af Amer: >60 mL/min (ref >60)
Glucose, Bld: 94 mg/dL (ref 70–99)
Potassium: 3.8 mmol/L (ref 3.5–5.1)
Sodium: 145 mmol/L (ref 135–145)
Total Bilirubin: 0.4 mg/dL (ref 0.3–1.2)
Total Protein: 6.5 g/dL (ref 6.5–8.1)

## 2018-09-01 LAB — CBC
HCT: 37 % (ref 36.0–46.0)
Hemoglobin: 11.8 g/dL — ABNORMAL LOW (ref 12.0–15.0)
MCH: 28.2 pg (ref 26.0–34.0)
MCHC: 31.9 g/dL (ref 30.0–36.0)
MCV: 88.3 fL (ref 80.0–100.0)
Platelets: 229 10*3/uL (ref 150–400)
RBC: 4.19 MIL/uL (ref 3.87–5.11)
RDW: 14.3 % (ref 11.5–15.5)
WBC: 10.8 10*3/uL — ABNORMAL HIGH (ref 4.0–10.5)
nRBC: 0 % (ref 0.0–0.2)

## 2018-09-01 LAB — I-STAT BETA HCG BLOOD, ED (MC, WL, AP ONLY): I-stat hCG, quantitative: <5 m[IU]/mL (ref <5)

## 2018-09-01 LAB — RAPID URINE DRUG SCREEN, HOSP PERFORMED
Amphetamines: NOT DETECTED
Barbiturates: NOT DETECTED
Benzodiazepines: NOT DETECTED
Cocaine: POSITIVE — AB
Opiates: NOT DETECTED
Tetrahydrocannabinol: NOT DETECTED

## 2018-09-01 LAB — SALICYLATE LEVEL: Salicylate Lvl: <7 mg/dL (ref 2.8–30.0)

## 2018-09-01 LAB — ACETAMINOPHEN LEVEL: Acetaminophen (Tylenol), Serum: <10 ug/mL — ABNORMAL LOW (ref 10–30)

## 2018-09-01 LAB — ETHANOL: Alcohol, Ethyl (B): <10 mg/dL (ref <10)

## 2018-09-01 NOTE — ED Triage Notes (Signed)
Patient from [facility] for medical clearance, c/o SI without plan x3 days. Reports last cocaine use today. Denies alcohol use. Denies HI/A/V/H.

## 2018-09-01 NOTE — H&P (Signed)
Behavioral Health Medical Screening Exam  Pamela Sanford is an 38 y.o. female presenting with her mother as a walk-in endorsing depressive sx with suicidality. Endorses use of illicit drugs heroin, opiates, cocaine and alcohol. She denies fever, chills or flu like sx.  Total Time spent with patient: 20 minutes  Psychiatric Specialty Exam: Physical Exam  Constitutional: She is oriented to person, place, and time. She appears well-developed and well-nourished. No distress.  HENT:  Head: Normocephalic.  Eyes: Pupils are equal, round, and reactive to light.  Cardiovascular: Normal rate.  Respiratory: Effort normal and breath sounds normal. No respiratory distress.  Neurological: She is alert and oriented to person, place, and time. No cranial nerve deficit.  Skin: Skin is warm and dry. She is not diaphoretic.  Psychiatric: Her speech is normal. She is withdrawn. Cognition and memory are normal. She expresses impulsivity. She exhibits a depressed mood. She expresses suicidal ideation. She expresses no homicidal ideation. She expresses suicidal plans. She expresses no homicidal plans.    Review of Systems  Constitutional: Negative for chills, diaphoresis, fever, malaise/fatigue and weight loss.  Psychiatric/Behavioral: Positive for depression, substance abuse and suicidal ideas. The patient is nervous/anxious.     There were no vitals taken for this visit.There is no height or weight on file to calculate BMI.  General Appearance: Casual  Eye Contact:  Good  Speech:  Clear and Coherent  Volume:  Normal  Mood:  Depressed  Affect:  Congruent  Thought Process:  Goal Directed  Orientation:  Full (Time, Place, and Person)  Thought Content:  Logical  Suicidal Thoughts:  Yes.  with intent/plan  Homicidal Thoughts:  No  Memory:  Immediate;   Fair  Judgement:  Fair  Insight:  Fair  Psychomotor Activity:  Normal  Concentration: Concentration: Fair  Recall:  [employer] of Knowledge:Fair   Language: Fair  Akathisia:  Negative  Handed:  Right  AIMS (if indicated):     Assets:  Desire for Improvement  Sleep:       Musculoskeletal: Strength & Muscle Tone: within normal limits Gait & Station: normal Patient leans: N/A  There were no vitals taken for this visit.  Recommendations:  Based on my evaluation the patient does not appear to have an emergency medical condition.  Kerry Hough, PA-C 09/01/2018, 9:19 PM

## 2018-09-01 NOTE — ED Provider Notes (Signed)
[facility]-EMERGENCY DEPT Provider Note   CSN: [id number] Arrival date & time: 09/01/18  2147    History   Chief Complaint Chief Complaint  Patient presents with  . Medical Clearance  . Suicidal    HPI Pamela Sanford is a 38 y.o. female with a PMH of cocaine abuse, depression, and GERD presenting with constant suicidal ideations onset 3 days ago. Patient reports she has past suicide attempts including overdose and cutting her wrists. Patient denies a plan currently. Patient states she has had increased stress due to her father's death and loss of custody for her children. Patient reports cocaine use daily. Patient reports occasional alcohol and opiate use, but denies any recent use. Patient denies chest pain, shortness of breath, or palpitations. Patient denies nausea, vomiting, abdominal pain, diarrhea, fever, chills, cough, congestion, or rhinorrhea. Patient denies sick exposures or recent travel. Patient denies hallucinations, delusions, or HI. Patient denies any acute complaints.    HPI  Past Medical History:  Diagnosis Date  . Asthma   . Cocaine abuse (HCC)   . Depression   . Foot fracture   . GERD (gastroesophageal reflux disease)   . Headache     Patient Active Problem List   Diagnosis Date Noted  . 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 Cocaine abuse (HCC) 07/22/2018  . Severe recurrent major depression without psychotic features (HCC) 07/21/2018    Past Surgical History:  Procedure Laterality Date  . LAPAROSCOPIC TUBAL LIGATION    . MULTIPLE TOOTH EXTRACTIONS    . WISDOM TOOTH EXTRACTION       OB History    Gravida  2   Para  2   Term  2   Preterm      AB      Living  2     SAB      TAB      Ectopic      Multiple      Live Births               Home Medications    Prior to Admission medications   Medication Sig Start Date End Date Taking? Authorizing Provider   albuterol (PROVENTIL HFA;VENTOLIN HFA) 108 (90 Base) MCG/ACT inhaler Inhale 2 puffs into the lungs every 6 (six) hours as needed for wheezing or shortness of breath. 07/23/18  Yes Clapacs, Jackquline Denmark, MD  nitrofurantoin, macrocrystal-monohydrate, (MACROBID) 100 MG capsule Take 1 capsule (100 mg total) by mouth every 12 (twelve) hours. Patient not taking: Reported on 09/01/2018 07/23/18   Clapacs, Jackquline Denmark, MD  sertraline (ZOLOFT) 50 MG tablet Take 1 tablet (50 mg total) by mouth daily. Patient not taking: Reported on 09/01/2018 07/24/18   Clapacs, Jackquline Denmark, MD  traZODone (DESYREL) 50 MG tablet Take 1 tablet (50 mg total) by mouth at bedtime as needed for sleep. Patient not taking: Reported on 09/01/2018 07/23/18   Clapacs, Jackquline Denmark, MD    Family History No family history on file.  Social History Social History   Tobacco Use  . Smoking status: Current Every Day Smoker    Packs/day: 0.04    Years: 8.00    Pack years: 0.32    Types: Cigarettes  . Smokeless tobacco: Never Used  Substance Use Topics  . Alcohol use: No  . Drug use: No     Allergies   Ibuprofen and Tramadol   Review of Systems Review of Systems  Constitutional: Negative for activity change,  appetite change, fatigue, fever and unexpected weight change.  HENT: Negative for congestion and rhinorrhea.   Respiratory: Negative for cough and shortness of breath.   Cardiovascular: Negative for chest pain and palpitations.  Gastrointestinal: Negative for abdominal pain, nausea and vomiting.  Endocrine: Negative for cold intolerance and heat intolerance.  Genitourinary: Negative for dysuria.  Musculoskeletal: Negative for back pain.  Skin: Negative for rash and wound.  Allergic/Immunologic: Negative for immunocompromised state.  Neurological: Negative for dizziness, weakness and headaches.  Psychiatric/Behavioral: Positive for dysphoric mood and suicidal ideas. Negative for agitation, behavioral problems, confusion, decreased  concentration, hallucinations, self-injury and sleep disturbance. The patient is not nervous/anxious and is not hyperactive.      Physical Exam Updated Vital Signs BP (!) 143/88   Pulse 92   Temp 98.4 F (36.9 C)   Resp 18   LMP 09/01/2018 Comment: states LMP was about 2 months ago  SpO2 99%   Physical Exam Vitals signs and nursing note reviewed.  Constitutional:      General: She is not in acute distress.    Appearance: She is well-developed. She is not diaphoretic.  HENT:     Head: Normocephalic and atraumatic.  Cardiovascular:     Rate and Rhythm: Normal rate and regular rhythm.     Heart sounds: Normal heart sounds. No murmur. No friction rub. No gallop.   Pulmonary:     Effort: Pulmonary effort is normal. No respiratory distress.     Breath sounds: Normal breath sounds. No wheezing or rales.  Abdominal:     Palpations: Abdomen is soft.     Tenderness: There is no abdominal tenderness.  Musculoskeletal: Normal range of motion.  Skin:    General: Skin is warm.     Findings: No erythema, lesion or rash.  Neurological:     Mental Status: She is alert and oriented to person, place, and time.  Psychiatric:        Attention and Perception: Attention normal. She is attentive.        Mood and Affect: Mood is depressed. Mood is not anxious. Affect is not labile, blunt, angry or inappropriate.        Speech: Speech normal. Speech is not rapid and pressured.        Behavior: Behavior normal. Behavior is not agitated or aggressive. Behavior is cooperative.        Thought Content: Thought content is not paranoid or delusional. Thought content includes suicidal ideation. Thought content does not include homicidal ideation. Thought content does not include homicidal or suicidal plan.        Cognition and Memory: Cognition normal.        Judgment: Judgment is impulsive.    ED Treatments / Results  Labs (all labs ordered are listed, but only abnormal results are displayed) Labs  Reviewed  COMPREHENSIVE METABOLIC PANEL - Abnormal; Notable for the following components:      Result Value   Chloride 114 (*)    Calcium 8.6 (*)    All other components within normal limits  ACETAMINOPHEN LEVEL - Abnormal; Notable for the following components:   Acetaminophen (Tylenol), Serum <10 (*)    All other components within normal limits  CBC - Abnormal; Notable for the following components:   WBC 10.8 (*)    Hemoglobin 11.8 (*)    All other components within normal limits  ETHANOL  SALICYLATE LEVEL  RAPID URINE DRUG SCREEN, HOSP PERFORMED  I-STAT BETA HCG BLOOD, ED ([facility], [facility], [facility] ONLY)  EKG None  Radiology No results found.  Procedures Procedures (including critical care time)  Medications Ordered in ED Medications - No data to display   Initial Impression / Assessment and Plan / ED Course  I have reviewed the triage vital signs and the nursing notes.  Pertinent labs & imaging results that were available during my care of the patient were reviewed by me and considered in my medical decision making (see chart for details).       Patient presents to the ER with suicidal ideations. Patient has a history of suicide attempts, depression, and substance abuse. Patient does not appear to be psychotic, is here voluntarily, is speaking openly about their current situation, discusses future plans. Labs, EKG, and vitals reviewed.  Current Plan is to have patient be evaluated by TTS for further assessment on weather or not to be placed inpatient.  We have discussed that If the patient feels she was becoming unsafe, instead of acting on an impulse of self harm he will contact the crisis line, speak to friend about it, or return to the emergency department. Patient has been cleared to move to [facility] pending further assessment.   Final Clinical Impressions(s) / ED Diagnoses   Final diagnoses:  Suicidal ideations    ED Discharge Orders    None       Leretha Dykes, [place]  09/01/18 2349    Derwood Kaplan, MD 09/02/18 0004

## 2018-09-01 NOTE — Progress Notes (Signed)
Per Donell Sievert, PA pt meets criteria for inpt treatment. Pt to be sent to [facility] for medical clearance due to hx of substance abuse. Pt has been tentatively accepted to [facility] pending medical clearance. TTS spoke with Reita Cliche, RN at [facility] who states he will inform charge nurse.  Princess Bruins, MSW, LCSW Therapeutic Triage Specialist  [phone]

## 2018-09-01 NOTE — ED Notes (Signed)
Bed: GBM2 Expected date:  Expected time:  Means of arrival:  Comments: [place]

## 2018-09-01 NOTE — ED Notes (Signed)
Pt [facility], pt only needs medical clearance. They state that they have a bed for her.
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
## 2018-09-02 ENCOUNTER — Other Ambulatory Visit: Payer: Self-pay

## 2018-09-02 ENCOUNTER — Inpatient Hospital Stay (HOSPITAL_COMMUNITY)
Admission: AD | Admit: 2018-09-02 | Discharge: 2018-09-04 | DRG: 885 | Disposition: A | Discharge status: Home / Self Care | Payer: No Typology Code available for payment source | Source: Intra-hospital | Attending: Psychiatry | Admitting: Psychiatry

## 2018-09-02 ENCOUNTER — Encounter (HOSPITAL_COMMUNITY): Payer: Self-pay

## 2018-09-02 DIAGNOSIS — F17210 Nicotine dependence, cigarettes, uncomplicated: Secondary | ICD-10-CM | POA: Diagnosis present

## 2018-09-02 DIAGNOSIS — R05 Cough: Secondary | ICD-10-CM

## 2018-09-02 DIAGNOSIS — Z79899 Other long term (current) drug therapy: Secondary | ICD-10-CM

## 2018-09-02 DIAGNOSIS — F1424 Cocaine dependence with cocaine-induced mood disorder: Secondary | ICD-10-CM

## 2018-09-02 DIAGNOSIS — F4310 Post-traumatic stress disorder, unspecified: Secondary | ICD-10-CM | POA: Diagnosis present

## 2018-09-02 DIAGNOSIS — Z886 Allergy status to analgesic agent status: Secondary | ICD-10-CM

## 2018-09-02 DIAGNOSIS — Z560 Unemployment, unspecified: Secondary | ICD-10-CM

## 2018-09-02 DIAGNOSIS — F1914 Other psychoactive substance abuse with psychoactive substance-induced mood disorder: Secondary | ICD-10-CM | POA: Diagnosis present

## 2018-09-02 DIAGNOSIS — K219 Gastro-esophageal reflux disease without esophagitis: Secondary | ICD-10-CM | POA: Diagnosis present

## 2018-09-02 DIAGNOSIS — F410 Panic disorder [episodic paroxysmal anxiety] without agoraphobia: Secondary | ICD-10-CM | POA: Diagnosis present

## 2018-09-02 DIAGNOSIS — R45851 Suicidal ideations: Secondary | ICD-10-CM

## 2018-09-02 DIAGNOSIS — I10 Essential (primary) hypertension: Secondary | ICD-10-CM | POA: Diagnosis present

## 2018-09-02 DIAGNOSIS — Z915 Personal history of self-harm: Secondary | ICD-10-CM

## 2018-09-02 DIAGNOSIS — Z885 Allergy status to narcotic agent status: Secondary | ICD-10-CM

## 2018-09-02 DIAGNOSIS — F1420 Cocaine dependence, uncomplicated: Secondary | ICD-10-CM | POA: Diagnosis present

## 2018-09-02 DIAGNOSIS — F332 Major depressive disorder, recurrent severe without psychotic features: Principal | ICD-10-CM | POA: Diagnosis present

## 2018-09-02 HISTORY — DX: Essential (primary) hypertension: I10

## 2018-09-02 MED ORDER — SERTRALINE HCL 50 MG PO TABS
50.0000 mg | ORAL_TABLET | Freq: Every day | ORAL | Status: DC
  Administered 2018-09-02 – 2018-09-04 (×3): 50 mg via ORAL
  Filled 2018-09-02 (×5): qty 1

## 2018-09-02 MED ORDER — MAGNESIUM HYDROXIDE 400 MG/5ML PO SUSP: 30.0000 mL | Freq: Every day | ORAL | Status: DC | PRN

## 2018-09-02 MED ORDER — HYDROXYZINE HCL 25 MG PO TABS
25.0000 mg | ORAL_TABLET | Freq: Four times a day (QID) | ORAL | Status: DC | PRN
  Administered 2018-09-03: 21:00:00 25 mg via ORAL
  Filled 2018-09-02: qty 1

## 2018-09-02 MED ORDER — ALUM & MAG HYDROXIDE-SIMETH 200-200-20 MG/5ML PO SUSP: 30.0000 mL | ORAL | Status: DC | PRN

## 2018-09-02 MED ORDER — ACETAMINOPHEN 325 MG PO TABS
650.0000 mg | ORAL_TABLET | Freq: Four times a day (QID) | ORAL | Status: DC | PRN
  Administered 2018-09-02: 14:00:00 650 mg via ORAL
  Filled 2018-09-02: qty 2

## 2018-09-02 MED ORDER — TRAZODONE HCL 50 MG PO TABS
50.0000 mg | ORAL_TABLET | Freq: Every evening | ORAL | Status: DC | PRN
  Administered 2018-09-03: 50 mg via ORAL
  Filled 2018-09-02 (×7): qty 1

## 2018-09-02 MED ORDER — BENZONATATE 100 MG PO CAPS
100.0000 mg | ORAL_CAPSULE | Freq: Every day | ORAL | Status: DC | PRN
  Administered 2018-09-03: 21:00:00 100 mg via ORAL
  Filled 2018-09-02: qty 1
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
## 2018-09-02 NOTE — ED Notes (Signed)
[facility] called and en route.  

## 2018-09-02 NOTE — [facility] Suicide Risk Assessment (Signed)
[facility] Admission Suicide Risk Assessment   Nursing information obtained from:  Patient Demographic factors:  Caucasian Current Mental Status:  Suicidal ideation indicated by patient, Self-harm thoughts, Self-harm behaviors Loss Factors:  Loss of significant relationship, Financial problems / change in socioeconomic status Historical Factors:  Impulsivity, Prior suicide attempts Risk Reduction Factors:  Living with another person, especially a relative  Total Time spent with patient: 45 minutes Principal Problem: Cocaine dependence, substance-induced mood disorder versus MDD Diagnosis:  Active Problems:   MDD (major depressive disorder), recurrent episode, severe (HCC)  Subjective Data:   Continued Clinical Symptoms:  Alcohol Use Disorder Identification Test Final Score (AUDIT): 0 The "Alcohol Use Disorders Identification Test", Guidelines for Use in Primary Care, Second Edition.  World Science writer [facility]). Score between 0-7:  no or low risk or alcohol related problems. Score between 8-15:  moderate risk of alcohol related problems. Score between 16-19:  high risk of alcohol related problems. Score 20 or above:  warrants further diagnostic evaluation for alcohol dependence and treatment.   CLINICAL FACTORS:  38 year old female, presented to hospital voluntarily reporting depression, neurovegetative symptoms, passive suicidal ideations.  Reports cocaine dependence and has been using crack cocaine daily over the last several weeks.  She describes a prior history of alcohol and opiate analgesic abuse but has not used these substances in several weeks.  She had a recent psychiatric admission for similar presentation and February 2020 at [facility].   Psychiatric Specialty Exam: Physical Exam  ROS  Blood pressure 91/62, pulse 88, temperature 97.8 F (36.6 C), temperature source Oral, resp. rate 18, height 5\' 2"  (1.575 m), weight 72.1 kg, last menstrual period 09/01/2018.Body  mass index is 29.08 kg/m.  See admit note mental status exam   COGNITIVE FEATURES THAT CONTRIBUTE TO RISK:  Closed-mindedness and Loss of executive function    SUICIDE RISK:   Moderate:  Frequent suicidal ideation with limited intensity, and duration, some specificity in terms of plans, no associated intent, good self-control, limited dysphoria/symptomatology, some risk factors present, and identifiable protective factors, including available and accessible social support.  PLAN OF CARE: Patient will be admitted to inpatient psychiatric unit for stabilization and safety. Will provide and encourage milieu participation. Provide medication management and maked adjustments as needed.  Will follow daily.    I certify that inpatient services furnished can reasonably be expected to improve the patient's condition.   Craige Cotta, MD 09/02/2018, 1:19 PM

## 2018-09-02 NOTE — Progress Notes (Addendum)
Patient ID: Pamela Sanford, female   DOB: 30-May-1981, 38 y.o.   MRN: [id number]  Nursing Progress Note [id number]  On initial approach, patient is seen resting in bed. Patient did wake up when prompted by RN but appeared tired likely due to being an early morning admission. Patient presents with sullen affect and forwarded little during conversation. Patient denied need for PRN medications for pain or anxiety this morning. Patient reports she did eat breakfast. Patient currently denies SI/HI/AVH. Patient denied concerns for writer and returned to sleep after assessment. Patient appears isolative to her room and has not engaged with peers or attended groups today.  Patient is educated about and provided medication per provider's orders. Patient safety maintained with q15 min safety checks and low fall risk precautions. Emotional support given, 1:1 interaction, and active listening provided. Patient encouraged to attend meals, groups, and work on treatment plan and goals. Labs, vital signs and patient behavior monitored throughout shift.   Patient contracts for safety with staff. Patient remains safe on the unit at this time and agrees to come to staff with any issues/concerns. Will continue to support and monitor.

## 2018-09-02 NOTE — Progress Notes (Signed)
Patient ID: Pamela Sanford, female   DOB: 04/25/1981, 38 y.o.   MRN: [id number]   Pamela Sanford was a walk-in seen by TTS but sent to [facility] for medical clearance due to hx of substance use. Reports increased depression with some suicidal thoughts over the past 3 days but no plan. She reports having family issues, financial issues, and not being able to see her kids that are currently with the father. She reports some recent substance use using cocaine and opiate pills over a week ago. She also reports she recently tried Heroin 1 time. She reports that this is her 3rd time in a psych facility but her first time here. Reports a hx of GERD, HTN, Asthma, HA, and depression but denies on any prescription medication. Drug screen positive for cocaine. She does have some scratches/abrasions to lt wrist/forearm area that she says are self-inflicted. Reports a past hx of cutting and an overdose. Currently living with a friend. Cooperative on admission.

## 2018-09-02 NOTE — Plan of Care (Signed)
  Problem: Education: Goal: Knowledge of [facility] Education information/materials will improve Outcome: Progressing   Problem: Safety: Goal: Periods of time without injury will increase Outcome: Progressing   

## 2018-09-02 NOTE — Tx Team (Signed)
Initial Treatment Plan 09/02/2018 1:47 AM Solace L Magri [id number]    PATIENT STRESSORS: Financial difficulties Loss of kids Marital or family conflict Substance abuse   PATIENT STRENGTHS: Ability for insight Average or above average intelligence Capable of independent living Communication skills Physical Health   PATIENT IDENTIFIED PROBLEMS:   "work on depression"   " suicidal thoughts"                 DISCHARGE CRITERIA:  Ability to meet basic life and health needs Adequate post-discharge living arrangements Reduction of life-threatening or endangering symptoms to within safe limits  PRELIMINARY DISCHARGE PLAN: Outpatient therapy Return to previous living arrangement  PATIENT/FAMILY INVOLVEMENT: This treatment plan has been presented to and reviewed with the patient, Pamela Sanford, and/or family member, .  The patient and family have been given the opportunity to ask questions and make suggestions.  Andres Ege, RN 09/02/2018, 1:47 AM

## 2018-09-02 NOTE — [facility] Suicide Risk Assessment (Signed)
[facility] INPATIENT:  Family/Significant Other Suicide Prevention Education  Suicide Prevention Education:  Patient Refusal for Family/Significant Other Suicide Prevention Education: The patient Pamela Sanford has refused to provide written consent for family/significant other to be provided Family/Significant Other Suicide Prevention Education during admission and/or prior to discharge.  Physician notified.  SPE completed with patient, as patient refused to consent to family contact. Patient was encouraged to share information with support network, ask questions, and talk about any concerns relating to SPE. Patient denies access to guns/firearms and verbalized understanding of information provided. Mobile Crisis information also provided to patient.   Maeola Sarah 09/02/2018, 10:41 AM

## 2018-09-02 NOTE — Tx Team (Signed)
Interdisciplinary Treatment and Diagnostic Plan Update  09/02/2018 Time of Session:  Pamela Sanford MRN: [id number]  Principal Diagnosis: <principal problem not specified>  Secondary Diagnoses: Active Problems:   MDD (major depressive disorder), recurrent episode, severe (HCC)   Current Medications:  Current Facility-Administered Medications  Medication Dose Route Frequency Provider Last Rate Last Dose  . acetaminophen (TYLENOL) tablet 650 mg  650 mg Oral Q6H PRN Laverle Hobby, PA-C      . alum & mag hydroxide-simeth (MAALOX/MYLANTA) 200-200-20 MG/5ML suspension 30 mL  30 mL Oral Q4H PRN Laverle Hobby, PA-C      . hydrOXYzine (ATARAX/VISTARIL) tablet 25 mg  25 mg Oral Q6H PRN Patriciaann Clan E, PA-C      . magnesium hydroxide (MILK OF MAGNESIA) suspension 30 mL  30 mL Oral Daily PRN Laverle Hobby, PA-C      . traZODone (DESYREL) tablet 50 mg  50 mg Oral QHS,MR X 1 Simon, Spencer E, PA-C       PTA Medications: Medications Prior to Admission  Medication Sig Dispense Refill Last Dose  . albuterol (PROVENTIL HFA;VENTOLIN HFA) 108 (90 Base) MCG/ACT inhaler Inhale 2 puffs into the lungs every 6 (six) hours as needed for wheezing or shortness of breath. 1 Inhaler 0 Past Week at Unknown time  . nitrofurantoin, macrocrystal-monohydrate, (MACROBID) 100 MG capsule Take 1 capsule (100 mg total) by mouth every 12 (twelve) hours. (Patient not taking: Reported on 09/01/2018) 14 capsule 0 Not Taking at Unknown time  . sertraline (ZOLOFT) 50 MG tablet Take 1 tablet (50 mg total) by mouth daily. (Patient not taking: Reported on 09/01/2018) 30 tablet 1 Not Taking at Unknown time  . traZODone (DESYREL) 50 MG tablet Take 1 tablet (50 mg total) by mouth at bedtime as needed for sleep. (Patient not taking: Reported on 09/01/2018) 30 tablet 1 Not Taking at Unknown time    Patient Stressors: Financial difficulties Loss of kids Marital or family conflict Substance abuse  Patient Strengths: Ability  for insight Average or above average intelligence Capable of independent living Communication skills Physical Health  Treatment Modalities: Medication Management, Group therapy, Case management,  1 to 1 session with clinician, Psychoeducation, Recreational therapy.   Physician Treatment Plan for Primary Diagnosis: <principal problem not specified> Long Term Goal(s):     Short Term Goals:    Medication Management: Evaluate patient's response, side effects, and tolerance of medication regimen.  Therapeutic Interventions: 1 to 1 sessions, Unit Group sessions and Medication administration.  Evaluation of Outcomes: Not Met  Physician Treatment Plan for Secondary Diagnosis: Active Problems:   MDD (major depressive disorder), recurrent episode, severe ([place])  Long Term Goal(s):     Short Term Goals:       Medication Management: Evaluate patient's response, side effects, and tolerance of medication regimen.  Therapeutic Interventions: 1 to 1 sessions, Unit Group sessions and Medication administration.  Evaluation of Outcomes: Not Met   RN Treatment Plan for Primary Diagnosis: <principal problem not specified> Long Term Goal(s): Knowledge of disease and therapeutic regimen to maintain health will improve  Short Term Goals: Ability to participate in decision making will improve, Ability to verbalize feelings will improve, Ability to disclose and discuss suicidal ideas and Ability to identify and develop effective coping behaviors will improve  Medication Management: RN will administer medications as ordered by provider, will assess and evaluate patient's response and provide education to patient for prescribed medication. RN will report any adverse and/or side effects to prescribing provider.  Therapeutic  Interventions: 1 on 1 counseling sessions, Psychoeducation, Medication administration, Evaluate responses to treatment, Monitor vital signs and CBGs as ordered, Perform/monitor CIWA,  COWS, AIMS and Fall Risk screenings as ordered, Perform wound care treatments as ordered.  Evaluation of Outcomes: Not Met   LCSW Treatment Plan for Primary Diagnosis: <principal problem not specified> Long Term Goal(s): Safe transition to appropriate next level of care at discharge, Engage patient in therapeutic group addressing interpersonal concerns.  Short Term Goals: Engage patient in aftercare planning with referrals and resources  Therapeutic Interventions: Assess for all discharge needs, 1 to 1 time with Social worker, Explore available resources and support systems, Assess for adequacy in community support network, Educate family and significant other(s) on suicide prevention, Complete Psychosocial Assessment, Interpersonal group therapy.  Evaluation of Outcomes: Not Met   Progress in Treatment: Attending groups: No. New to unit  Participating in groups: No. Taking medication as prescribed: Yes. Toleration medication: Yes. Family/Significant other contact made: No, will contact:  if patient consents to collateral contacts Patient understands diagnosis: Yes. Discussing patient identified problems/goals with staff: Yes. Medical problems stabilized or resolved: Yes. Denies suicidal/homicidal ideation: Yes. Issues/concerns per patient self-inventory: No. Other:   New problem(s) identified: None  New Short Term/Long Term Goal(s): medication stabilization, elimination of SI thoughts, development of comprehensive mental wellness plan.    Patient Goals:  work on depression   Discharge Plan or Barriers: CSW will continue to follow and assess for appropriate referrals and possible discharge planning.   Reason for Continuation of Hospitalization: Depression Medication stabilization Suicidal ideation  Estimated Length of Stay: 09/07/2018  Attendees: Patient: 09/02/2018 8:26 AM  Physician: Dr. Neita Garnet, MD 09/02/2018 8:26 AM  Nursing: Rise Paganini.Raliegh Ip, RN 09/02/2018 8:26 AM  RN  Care Manager: 09/02/2018 8:26 AM  Social Worker: Radonna Ricker, [place] 09/02/2018 8:26 AM  Recreational Therapist:  09/02/2018 8:26 AM  Other:  09/02/2018 8:26 AM  Other:  09/02/2018 8:26 AM  Other: 09/02/2018 8:26 AM    Scribe for Treatment Team: Marylee Floras, [place] 09/02/2018 8:26 AM

## 2018-09-02 NOTE — Progress Notes (Signed)
Pt accepted to [facility] 400-1 to Dr. Jama Flavors, MD. Call to report [phone]. Gerilyn Pilgrim, RN has been advised.   Princess Bruins, MSW, LCSW Therapeutic Triage Specialist  [phone]

## 2018-09-02 NOTE — [facility] Group Notes (Signed)
[facility] Group Notes:  Nursing Education  Date:  09/02/2018  Time:  4:00 PM  Type of Therapy:  Psychoeducational Skills  Participation Level:  Did Not Attend  Modes of Intervention:  Activity, Discussion and Socialization  Summary of Progress/Problems: Patient was invited but declined to attend group.  Ndia Sampath A Katlyn Muldrew 09/02/2018, 5:00 PM 

## 2018-09-03 NOTE — Progress Notes (Signed)
Shavana attended wrap-up group. Pt appears flat in affect and mood. Pt denies SI/HI/AVH/Pain at this time. Pt is guarded with interaction. Pt states she will back to [place] upon d/c to live with friends/family. Pt states she is unsure of transportation. Pt was encourage to speak with a Child psychotherapist. Support provided. Will continue with POC.

## 2018-09-03 NOTE — [facility] Group Notes (Signed)
Adult Psychoeducational Group Note  Date:  09/03/2018 Time:  10:48 AM  Group Topic/Focus:  Self Care:   The focus of this group is to help patients understand the importance of self-care in order to improve or restore emotional, physical, spiritual, interpersonal, and financial health.  Participation Level:  Active  Participation Quality:  Appropriate  Affect:  Appropriate  Cognitive:  Alert  Insight: Appropriate  Engagement in Group:  Engaged  Modes of Intervention:  Orientation  Additional Comments:  Pt attended and participated in group activity (question ball)  Dellia Nims 09/03/2018, 10:48 AM

## 2018-09-03 NOTE — [facility] Group Notes (Signed)
LCSW Group Therapy Note 09/03/2018 3:14 PM  Type of Therapy and Topic: Group Therapy: Overcoming Obstacles  Participation Level: Minimal  Description of Group:  In this group patients will be encouraged to explore what they see as obstacles to their own wellness and recovery. They will be guided to discuss their thoughts, feelings, and behaviors related to these obstacles. The group will process together ways to cope with barriers, with attention given to specific choices patients can make. Each patient will be challenged to identify changes they are motivated to make in order to overcome their obstacles. This group will be process-oriented, with patients participating in exploration of their own experiences as well as giving and receiving support and challenge from other group members.  Therapeutic Goals: 1. Patient will identify personal and current obstacles as they relate to admission. 2. Patient will identify barriers that currently interfere with their wellness or overcoming obstacles.  3. Patient will identify feelings, thought process and behaviors related to these barriers. 4. Patient will identify two changes they are willing to make to overcome these obstacles:   Summary of Patient Progress  Pamela Sanford was in attendance, however she did not participate in the group's discussion.   Therapeutic Modalities:  Cognitive Behavioral Therapy Solution Focused Therapy Motivational Interviewing Relapse Prevention Therapy   Pamela Sanford Clinical Social Worker

## 2018-09-03 NOTE — Progress Notes (Signed)
[facility] MD Progress Note  09/03/2018 9:25 AM Pamela Sanford  MRN:  [id number] Subjective: Patient reports improvement compared to how she felt at admission.  States her mood is "getting there".  Also endorses improved sleep.  Currently denies suicidal ideations.  Denies medication side effects.  Presents future oriented, and states that she plans to return to live with  family's friends after discharge.  Objective : I have discussed case with treatment team and have met with patient.  38 year old female, presented to hospital voluntarily reporting depression, neurovegetative symptoms, passive suicidal ideations.  Reports cocaine dependence and has been using crack cocaine daily over the last several weeks.  She describes a prior history of alcohol and opiate analgesic abuse but has not used these substances in several weeks.  She had a recent psychiatric admission for similar presentation and February 2020 at [facility]  Patient reports feeling better than she did at admission.  Presents vaguely constricted/cautious but affect tends to improve during session and smiles at times appropriately.  Currently denies suicidal ideations and presents future oriented. She is not endorsing any withdrawal symptoms and does not appear to be in any acute distress or discomfort.  As noted, reports past history of polysubstance dependence but states she was using only cocaine recently/denies recent alcohol, benzodiazepine, or opiate use. Visible on unit, no disruptive or agitated behaviors.  Currently denies medication side effects.   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 Asthma    denies medications  . Cocaine abuse ([place])   .  Depression   . Foot fracture   . GERD (gastroesophageal reflux disease)    denies medications  . Headache   . Hypertension     Past Surgical History:  Procedure Laterality Date  . LAPAROSCOPIC TUBAL LIGATION    . MULTIPLE TOOTH EXTRACTIONS    . WISDOM TOOTH EXTRACTION     Family History: History reviewed. No pertinent family history. Family Psychiatric  History:  Social History:  Social History   Substance and Sexual Activity  Alcohol Use No     Social History   Substance and Sexual Activity  Drug Use Yes  . Types: Cocaine    Social History   Socioeconomic History  . Marital status: Single    Spouse name: Not on file  . Number of children: Not on file  . Years of education: Not on file  . Highest education level: Not on file  Occupational History  . Not on file  Social Needs  . Financial resource strain: Not on file  . Food insecurity:    Worry: Not on file    Inability: Not on file  . Transportation needs:    Medical: Not on file    Non-medical: Not on file  Tobacco Use  . Smoking status: Current Every Day Smoker    Packs/day: 1.00    Years: 8.00    Pack years: 8.00    Types: Cigarettes  . Smokeless tobacco: Never Used  Substance and Sexual Activity  . Alcohol use: No  . Drug use: Yes    Types: Cocaine  . Sexual activity: Not Currently  Lifestyle  . Physical activity:    Days per week: Not on file    Minutes per session:  Not on file  . Stress: Not on file  Relationships  . Social connections:    Talks on phone: Not on file    Gets together: Not on file    Attends religious service: Not on file    Active member of club or organization: Not on file    Attends meetings of clubs or organizations: Not on file    Relationship status: Not on file  Other Topics Concern  . Not on file  Social History Narrative  . Not on file   Additional Social History:   Sleep: Fair  Appetite:  Improving  Current Medications: Current Facility-Administered  Medications  Medication Dose Route Frequency Provider Last Rate Last Dose  . acetaminophen (TYLENOL) tablet 650 mg  650 mg Oral Q6H PRN Laverle Hobby, PA-C   650 mg at 09/02/18 1405  . alum & mag hydroxide-simeth (MAALOX/MYLANTA) 200-200-20 MG/5ML suspension 30 mL  30 mL Oral Q4H PRN Laverle Hobby, PA-C      . benzonatate (TESSALON) capsule 100 mg  100 mg Oral Daily PRN Cache Bills, Myer Peer, MD      . hydrOXYzine (ATARAX/VISTARIL) tablet 25 mg  25 mg Oral Q6H PRN Patriciaann Clan E, PA-C      . magnesium hydroxide (MILK OF MAGNESIA) suspension 30 mL  30 mL Oral Daily PRN Laverle Hobby, PA-C      . sertraline (ZOLOFT) tablet 50 mg  50 mg Oral Daily Guillermo Difrancesco, Myer Peer, MD   50 mg at 09/03/18 0800  . traZODone (DESYREL) tablet 50 mg  50 mg Oral QHS,MR X 1 Laverle Hobby, PA-C        Lab Results:  Results for orders placed or performed during the hospital encounter of 09/01/18 (from the past 48 hour(s))  Comprehensive metabolic panel     Status: Abnormal   Collection Time: 09/01/18 10:09 PM  Result Value Ref Range   Sodium 145 135 - 145 mmol/L   Potassium 3.8 3.5 - 5.1 mmol/L   Chloride 114 (H) 98 - 111 mmol/L   CO2 25 22 - 32 mmol/L   Glucose, Bld 94 70 - 99 mg/dL   BUN 10 6 - 20 mg/dL   Creatinine, Ser 0.93 0.44 - 1.00 mg/dL   Calcium 8.6 (L) 8.9 - 10.3 mg/dL   Total Protein 6.5 6.5 - 8.1 g/dL   Albumin 3.7 3.5 - 5.0 g/dL   AST 15 15 - 41 U/L   ALT 11 0 - 44 U/L   Alkaline Phosphatase 59 38 - 126 U/L   Total Bilirubin 0.4 0.3 - 1.2 mg/dL   GFR calc non Af Amer >60 >60 mL/min   GFR calc Af Amer >60 >60 mL/min   Anion gap 6 5 - 15    Comment: Performed at [facility], [place] [address] [place], [place] [address]  Ethanol     Status: None   Collection Time: 09/01/18 10:09 PM  Result Value Ref Range   Alcohol, Ethyl (B) <10 <10 mg/dL    Comment: (NOTE) Lowest detectable limit for serum alcohol is 10 mg/dL. For medical purposes only. Performed at [facility], [place] [address], [place], [place] [address]   Salicylate level     Status: None   Collection Time: 09/01/18 10:09 PM  Result Value Ref Range   Salicylate Lvl <7.6 2.8 - 30.0 mg/dL    Comment: Performed at [facility], [place] [address], [place], [place] [address]  Acetaminophen  level     Status: Abnormal   Collection Time: 09/01/18 10:09 PM  Result Value Ref Range   Acetaminophen (Tylenol), Serum <10 (L) 10 - 30 ug/mL    Comment: (NOTE) Therapeutic concentrations vary significantly. A range of 10-30 ug/mL  may be an effective concentration for many patients. However, some  are best treated at concentrations outside of this range. Acetaminophen concentrations >150 ug/mL at 4 hours after ingestion  and >50 ug/mL at 12 hours after ingestion are often associated with  toxic reactions. Performed at [facility], [place] [address] [place], [place] [address]   cbc     Status: Abnormal   Collection Time: 09/01/18 10:09 PM  Result Value Ref Range   WBC 10.8 (H) 4.0 - 10.5 K/uL   RBC 4.19 3.87 - 5.11 MIL/uL   Hemoglobin 11.8 (L) 12.0 - 15.0 g/dL   HCT 37.0 36.0 - 46.0 %   MCV 88.3 80.0 - 100.0 fL   MCH 28.2 26.0 - 34.0 pg   MCHC 31.9 30.0 - 36.0 g/dL   RDW 14.3 11.5 - 15.5 %   Platelets 229 150 - 400 K/uL   nRBC 0.0 0.0 - 0.2 %    Comment: Performed at [facility], [place] [address], [place], [place] [address]  I-Stat beta hCG blood, ED     Status: None   Collection Time: 09/01/18 10:13 PM  Result Value Ref Range   I-stat hCG, quantitative <5.0 <5 mIU/mL   Comment 3            Comment:   GEST. AGE      CONC.  (mIU/mL)   <=1 WEEK        5 - 50     2 WEEKS       50 - 500     3 WEEKS       100 - 10,000     4 WEEKS     1,000 - 30,000        FEMALE AND NON-PREGNANT FEMALE:     LESS THAN 5 mIU/mL   Rapid urine drug screen (hospital performed)     Status: Abnormal   Collection Time: 09/01/18 11:12 PM  Result Value  Ref Range   Opiates NONE DETECTED NONE DETECTED   Cocaine POSITIVE (A) NONE DETECTED   Benzodiazepines NONE DETECTED NONE DETECTED   Amphetamines NONE DETECTED NONE DETECTED   Tetrahydrocannabinol NONE DETECTED NONE DETECTED   Barbiturates NONE DETECTED NONE DETECTED    Comment: (NOTE) DRUG SCREEN FOR MEDICAL PURPOSES ONLY.  IF CONFIRMATION IS NEEDED FOR ANY PURPOSE, NOTIFY LAB WITHIN 5 DAYS. LOWEST DETECTABLE LIMITS FOR URINE DRUG SCREEN Drug Class                     Cutoff (ng/mL) Amphetamine and metabolites    1000 Barbiturate and metabolites    200 Benzodiazepine                 144 Tricyclics and metabolites     300 Opiates and metabolites        300 Cocaine and metabolites        300 THC                            50 Performed at [facility], [place] [address], [place],  [address]     Blood Alcohol level:  Lab Results  Component Value  Date   ETH <10 09/01/2018   ETH <10 32/67/1245    Metabolic Disorder Labs: Lab Results  Component Value Date   HGBA1C 5.3 07/22/2018   MPG 105 07/22/2018   MPG 100 07/21/2018   No results found for: PROLACTIN Lab Results  Component Value Date   CHOL 143 07/22/2018   TRIG 131 07/22/2018   HDL 30 (L) 07/22/2018   CHOLHDL 4.8 07/22/2018   VLDL 26 07/22/2018   LDLCALC 87 07/22/2018   LDLCALC 84 07/21/2018    Physical Findings: AIMS: Facial and Oral Movements Muscles of Facial Expression: None, normal Lips and Perioral Area: None, normal Jaw: None, normal Tongue: None, normal,Extremity Movements Upper (arms, wrists, hands, fingers): None, normal Lower (legs, knees, ankles, toes): None, normal, Trunk Movements Neck, shoulders, hips: None, normal, Overall Severity Severity of abnormal movements (highest score from questions above): None, normal Incapacitation due to abnormal movements: None, normal Patient's awareness of abnormal movements (rate only patient's report): No Awareness, Dental  Status Current problems with teeth and/or dentures?: No Does patient usually wear dentures?: No  CIWA:    COWS:     Musculoskeletal: Strength & Muscle Tone: within normal limits Gait & Station: normal Patient leans: N/A  Psychiatric Specialty Exam: Physical Exam  ROS no chest pain, no shortness of breath.of note, does not currently endorse coughing and no coughing is noted during our session . No vomiting, no fever, no chills  Blood pressure 113/67, pulse 73, temperature 98.3 F (36.8 C), temperature source Oral, resp. rate 18, height _0  (1.575 m), weight 72.1 kg, last menstrual period 09/01/2018.Body mass index is 29.08 kg/m.  General Appearance: Improving grooming  Eye Contact:  Fair-improves during session  Speech:  Normal Rate  Volume:  Normal  Mood:  Reports she is feeling better/less depressed  Affect:  Vaguely anxious/constricted but improves during session.  Smiles at times appropriately  Thought Process:  Linear and Descriptions of Associations: Intact  Orientation:  Full (Time, Place, and Person)  Thought Content:  No hallucinations, no delusions, not internally preoccupied  Suicidal Thoughts:  No currently denies suicidal or self-injurious ideations, denies homicidal or violent ideations  Homicidal Thoughts:  No  Memory:  Recent and remote grossly intact  Judgement:  Other:  Fair/improving  Insight:  Fair  Psychomotor Activity:  Normal-no psychomotor agitation  Concentration:  Concentration: Good and Attention Span: Good  Recall:  Good  Fund of Knowledge:  Good  Language:  Good  Akathisia:  Negative  Handed:  Right  AIMS (if indicated):     Assets:  Communication Skills Desire for Improvement Resilience  ADL's:  Intact  Cognition:  WNL  Sleep:  Number of Hours: 3.5   Assessment:  38 year old female, presented to hospital voluntarily reporting depression, neurovegetative symptoms, passive suicidal ideations.  Reports cocaine dependence and has been using  crack cocaine daily over the last several weeks.  She describes a prior history of alcohol and opiate analgesic abuse but has not used these substances in several weeks.  She had a recent psychiatric admission for similar presentation and February 2020 at [facility].  Patient reports improving mood.  Affect remains vaguely constricted/cautious but more reactive.  Denies SI and presents future oriented.  Tolerating medications well at this time  Treatment Plan Summary: Daily contact with patient to assess and evaluate symptoms and progress in treatment, Medication management, Plan Inpatient treatment and Medications as below Encourage group/milieu participation to work on coping skills and symptom reduction Encourage efforts to work on  sobriety/relapse prevention Continue Zoloft 50 mg daily for depression/anxiety Continue Trazodone 50 mg nightly PRN for insomnia Continue Vistaril 25 mg every 6 hours PRN for anxiety Treatment team working on disposition planning options Jenne Campus, MD 09/03/2018, 9:25 AM

## 2018-09-03 NOTE — BHH Group Notes (Signed)
Adult Psychoeducational Group Note  Date:  09/03/2018 Time:  9:07 AM  Group Topic/Focus:  Goals Group:   The focus of this group is to help patients establish daily goals to achieve during treatment and discuss how the patient can incorporate goal setting into their daily lives to aide in recovery.  Participation Level:  Active  Participation Quality:  Appropriate  Affect:  Appropriate  Cognitive:  Alert  Insight: Appropriate  Engagement in Group:  Engaged  Modes of Intervention:  Orientation  Additional Comments:  Pt attended orientation/goals group. Pt goal for today is to figure out discharge plan with doctor and social worker.  Dellia Nims 09/03/2018, 9:07 AM

## 2018-09-03 NOTE — Plan of Care (Signed)
Problem: Safety: Goal: Periods of time without injury will increase Outcome: Progressing   Problem: Medication: Goal: Compliance with prescribed medication regimen will improve Outcome: Progressing   Problem: Health Behavior/Discharge Planning: Goal: Ability to identify and utilize available resources and services will improve Outcome: Progressing DAR Note: Pt visible on phone at intervals during shift. Took her medications as ordered without issues. Denies SI, HI, AVH and pain when assessed. Speech is soft with elective mutism at intervals. Rates her depression 1/10, hopelessness 0/10 and anxiety 1/10 on self inventory sheet. States she slept well last night with good appetite, normal energy and good concentration level. Pt attended groups and was minimally engaged.  Emotional support and encouragement offered to pt as needed throughout this shift. Scheduled medications given per MD's order's with verbal education and effects monitored. Pt tolerated all PO intake well. POC continues for safety and mood stability. Pt remains safe on and off unit.

## 2018-09-03 NOTE — BHH Group Notes (Signed)
BHH Group Notes:  (Nursing/MHT/Case Management/Adjunct)  Date:  09/03/2018  Time:  4:58 PM  Type of Therapy:  Psychoeducational Skills  Participation Level:  Minimal  Participation Quality:  Appropriate  Affect:  Appropriate  Cognitive:  Appropriate  Insight:  Appropriate  Engagement in Group:  Resistant  Modes of Intervention:  Discussion and Education  Summary of Progress/Problems: Patient attended but did not participate.  Audrie Lia Emre Stock 09/03/2018, 4:58 PM

## 2018-09-04 DIAGNOSIS — F1424 Cocaine dependence with cocaine-induced mood disorder: Secondary | ICD-10-CM

## 2018-09-04 MED ORDER — TRAZODONE HCL 50 MG PO TABS: 50.0000 mg | ORAL_TABLET | Freq: Every evening | ORAL | 0 refills | Status: DC | PRN

## 2018-09-04 MED ORDER — TRAZODONE HCL 50 MG PO TABS
50.0000 mg | ORAL_TABLET | Freq: Every evening | ORAL | Status: DC | PRN
  Filled 2018-09-04: qty 1

## 2018-09-04 MED ORDER — SERTRALINE HCL 50 MG PO TABS: 50.0000 mg | ORAL_TABLET | Freq: Every day | ORAL | 0 refills | Status: DC

## 2018-09-04 NOTE — Progress Notes (Signed)
Recreation Therapy Notes  Date:  4.3.20 Time: 0930 Location: [address] Dayroom  Group Topic: Stress Management  Goal Area(s) Addresses:  Patient will identify positive stress management techniques. Patient will identify benefits of using stress management post d/c.  Intervention: Stress Management  Activity :  Guided Imagery.  LRT read a script that guided patients in envisioning sitting in a field and enjoying summer clouds.  Patients were to follow along as script was read to participate in meditation.  Education:  Stress Management, Discharge Planning.   Education Outcome: Acknowledges Education  Clinical Observations/Feedback:  Pt did not attend group.     Electra Paladino, LRT/CTRS        Valerian Jewel A 09/04/2018 11:57 AM 

## 2018-09-04 NOTE — BHH Group Notes (Signed)
The focus of this group is to help patients review their daily goal of treatment and discuss progress on daily workbooks.  Pt expressed her day was pretty good, she rated it a 7. Pt expressed that she wanted to engage more and sleep less during the day as her goal.

## 2018-09-04 NOTE — Progress Notes (Signed)
  [facility] Adult Case Management Discharge Plan :  Will you be returning to the same living situation after discharge:  Yes,  patient reports she is returning home with a family friend At discharge, do you have transportation home?: Yes,  taxi voucher Do you have the ability to pay for your medications: No.  Release of information consent forms completed and in the chart;  Patient's signature needed at discharge.  Patient to Follow up at: Follow-up Information    [facility] Follow up.   Why:  Please call clinic within 24 hours of discharge to set up an hospital follow up appointment. At this time the appointment will be conducted over video chat. Contact information: [address] [place] [address] [phone]           Next level of care provider has access to [facility] Link:yes  Safety Planning and Suicide Prevention discussed: Yes,  with the patient   Have you used any form of tobacco in the last 30 days? (Cigarettes, Smokeless Tobacco, Cigars, and/or Pipes): Yes  Has patient been referred to the Quitline?: Patient refused referral  Patient has been referred for addiction treatment: Pt. refused referral  Maeola Sarah, LCSWA 09/04/2018, 1:53 PM

## 2018-09-04 NOTE — BHH Suicide Risk Assessment (Addendum)
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   Blood pressure (!) 105/56, pulse 80, temperature 98.2 F (36.8 C), temperature source Oral, resp. rate 18, height 5\' 2"  (1.575 m), weight 72.1 kg, last menstrual period 09/01/2018.Body mass index is 29.08 kg/m.  General Appearance: Improved grooming  Eye Contact::  Good  Speech:  Normal Rate409  Volume:  Decreased  Mood:  Reports feeling better and currently denies depression  Affect:  Vaguely guarded, becomes more reactive as session progresses, smiles appropriately at times during session  Thought Process:  Linear and Descriptions of Associations: Intact  Orientation:  Other:  Fully alert and attentive  Thought Content:  denies hallucinations / no delusions   Suicidal Thoughts:  No denies any suicidal ideations, denies self-injurious ideations  Homicidal Thoughts:  No no homicidal or violent ideations  Memory:  Recent and remote grossly intact  Judgement:  Other:  Fair/improving  Insight:  Fair/improving  Psychomotor Activity:  Normal  Concentration:  Good  Recall:  Good  Fund of Knowledge:Good  Language: Good  Akathisia:  Negative  Handed:  Right  AIMS (if indicated):     Assets:  Desire for Improvement Resilience  Sleep:  Number of Hours: 6.75  Cognition: WNL  ADL's:  Intact   Mental Status Per Nursing Assessment::   On Admission:  Suicidal ideation indicated by patient, Self-harm thoughts, Self-harm  behaviors  Demographic Factors:  38 year old female, has 3 children who are currently living with their father, she is living with a family friend.  Unemployed  Loss Factors: Not having custody of her children, unemployment, substance abuse Historical Factors: History of prior psychiatric admissions, history of suicide attempt as a teenager, recent psychiatric admission in February 2020 due to depression and substance abuse.  History of opiate abuse  Risk Reduction Factors:   Sense of responsibility to family, Living with another person, especially a relative and Positive coping skills or problem solving skills  Continued Clinical Symptoms:  Patient presents alert, attentive, calm, pleasant on approach.  Denies depression and states she is feeling "better".  Affect is somewhat guarded but improves during session and does smile appropriately at times.  No thought disorder.  Denies any suicidal ideations, denies any homicidal ideations, no hallucinations, no delusions, future oriented. Denies medication side effects.  No disruptive or agitated behaviors on unit.  Milieu participation has been limited.  Presents polite/cooperative on approach.  Denies cravings and states she intends to remain sober/abstinent.  Cognitive Features That Contribute To Risk:  No gross cognitive deficits noted upon discharge. Is alert , attentive, and oriented x 3   Suicide Risk:  Mild:  Suicidal ideation of limited frequency, intensity, duration, and specificity.  There are no identifiable plans, no associated intent, mild dysphoria and related symptoms, good self-control (both objective and subjective assessment), few other risk factors, and identifiable protective factors, including available and accessible social support.  Follow-up Information    [facility] Follow up.   Why:  Please call clinic within 24 hours of discharge to set up  an hospital follow up appointment. At this time the  appointment will be conducted over video chat. Contact information: [address] [place] [address] [phone]           Plan Of Care/Follow-up recommendations:  Activity:  As tolerated Diet:  Regular Tests:  NA Other:  See below Patient is expressing readiness for discharge.  There are no current grounds for involuntary commitment.  She is leaving in good spirits.  She plans to return to live with her family friend.  Follow-up as above.  Importance of avoiding people, places, situations associated with substance use reviewed and emphasized. We also discussed likely benefits of 12-step program participation.  Craige Cotta, MD 09/04/2018, 11:09 AM

## 2018-09-04 NOTE — Discharge Summary (Addendum)
Physician Discharge Summary Note  Patient:  Pamela Sanford is an 38 y.o., female MRN:  [id number] DOB:  [birth year] Patient phone:  [phone] (home)  Patient address:   [address] [place] [address],  Total Time spent with patient: 15 minutes  Date of Admission:  09/02/2018 Date of Discharge: 09/04/2018    Reason for Admission: Per assessment note: 38 year old female.Presented to hospital voluntarily. States she has been feeling depressed and recently experiencing suicidal ideations, which she currently characterizes as passive . She explains " I thought I had to come in before things got really bad ".  States " I also wanted help to stop using drugs ", and identifies cocaine as her current substance of choice . She has been using cocaine regularly and often daily over the past several weeks. Of note, reports prior history of abusing alcohol and opiate analgesics but has not used these in a period of weeks. Patient attributes depression in part to recently losing custody of her children about two months ago , recent death of her grandfather, but also acknowledges cocaine may be helping her feel better " in the moment", but causing worsening mood with repeated/regular use .  Principal Problem: MDD (major depressive disorder), recurrent episode, severe (HCC) Discharge Diagnoses: Principal Problem:   MDD (major depressive disorder), recurrent episode, severe (HCC)   Past Psychiatric History:   Past Medical History:  Past Medical History:  Diagnosis Date  . Asthma    denies medications  . Cocaine abuse (HCC)   . Depression   . Foot fracture   . GERD (gastroesophageal reflux disease)    denies medications  . Headache   . Hypertension     Past Surgical History:  Procedure Laterality Date  . LAPAROSCOPIC TUBAL LIGATION    . MULTIPLE TOOTH EXTRACTIONS    . WISDOM TOOTH EXTRACTION     Family History: History reviewed. No pertinent family history. Family Psychiatric  History:   Social History:  Social History   Substance and Sexual Activity  Alcohol Use No     Social History   Substance and Sexual Activity  Drug Use Yes  . Types: Cocaine    Social History   Socioeconomic History  . Marital status: Single    Spouse name: Not on file  . Number of children: Not on file  . Years of education: Not on file  . Highest education level: Not on file  Occupational History  . Not on file  Social Needs  . Financial resource strain: Not on file  . Food insecurity:    Worry: Not on file    Inability: Not on file  . Transportation needs:    Medical: Not on file    Non-medical: Not on file  Tobacco Use  . Smoking status: Current Every Day Smoker    Packs/day: 1.00    Years: 8.00    Pack years: 8.00    Types: Cigarettes  . Smokeless tobacco: Never Used  Substance and Sexual Activity  . Alcohol use: No  . Drug use: Yes    Types: Cocaine  . Sexual activity: Not Currently  Lifestyle  . Physical activity:    Days per week: Not on file    Minutes per session: Not on file  . Stress: Not on file  Relationships  . Social connections:    Talks on phone: Not on file    Gets together: Not on file    Attends religious service: Not on file  Active member of club or organization: Not on file    Attends meetings of clubs or organizations: Not on file    Relationship status: Not on file  Other Topics Concern  . Not on file  Social History Narrative  . Not on file    Hospital Course:  Pamela Sanford was admitted for MDD (major depressive disorder), recurrent episode, severe (HCC)  and crisis management.  Pt was treated discharged with the medications listed below under Medication List.  Medical problems were identified and treated as needed.  Home medications were restarted as appropriate.  Improvement was monitored by observation and Pamela Sanford 's daily report of symptom reduction.  Emotional and mental status was monitored by daily  self-inventory reports completed by Pamela Sanford and clinical staff.         Pamela Sanford was evaluated by the treatment team for stability and plans for continued recovery upon discharge. Pamela Sanford 's motivation was an integral factor for scheduling further treatment. Employment, transportation, bed availability, health status, family support, and any pending legal issues were also considered during hospital stay. Pt was offered further treatment options upon discharge including but not limited to Residential, Intensive Outpatient, and Outpatient treatment.  Pamela Sanford will follow up with the services as listed below under Follow Up Information.     Upon completion of this admission the patient was both mentally and medically stable for discharge denying suicidal or homicidal ideations.   Pamela Sanford responded well to treatment with Zoloft  and Trazodone 50 mg  and without adverse effects. Pt demonstrated improvement without reported or observed adverse effects to the point of stability appropriate for outpatient management. Pertinent labs include: CBC and  CMP for which outpatient follow-up is necessary for lab recheck as mentioned below. Reviewed CBC, CMP, BAL, and UDS+ Cocain; all unremarkable aside from noted exceptions.   Physical Findings: AIMS: Facial and Oral Movements Muscles of Facial Expression: None, normal Lips and Perioral Area: None, normal Jaw: None, normal Tongue: None, normal,Extremity Movements Upper (arms, wrists, hands, fingers): None, normal Lower (legs, knees, ankles, toes): None, normal, Trunk Movements Neck, shoulders, hips: None, normal, Overall Severity Severity of abnormal movements (highest score from questions above): None, normal Incapacitation due to abnormal movements: None, normal Patient's awareness of abnormal movements (rate only patient's report): No Awareness, Dental Status Current problems with teeth and/or  dentures?: No Does patient usually wear dentures?: No  CIWA:    COWS:     Musculoskeletal: Strength & Muscle Tone: within normal limits Gait & Station: normal Patient leans: N/A  Psychiatric Specialty Exam: See SRA by MD  Physical Exam  Nursing note and vitals reviewed. Psychiatric: She has a normal mood and affect. Her behavior is normal.    Review of Systems  Psychiatric/Behavioral: Negative for depression (stable) and suicidal ideas. The patient is not nervous/anxious.   All other systems reviewed and are negative.   Blood pressure (!) 105/56, pulse 80, temperature 98.2 F (36.8 C), temperature source Oral, resp. rate 18, height  (1.575 m), weight 72.1 kg, last menstrual period 09/01/2018.Body mass index is 29.08 kg/m.    Have you used any form of tobacco in the last 30 days? (Cigarettes, Smokeless Tobacco, Cigars, and/or Pipes): Yes  Has this patient used any form of tobacco in the last 30 days? (Cigarettes, Smokeless Tobacco, Cigars, and/or Pipes) No  Blood Alcohol level:  Lab Results  Component Value Date   ETH <10 09/01/2018  ETH <10 08/09/2018    Metabolic Disorder Labs:  Lab Results  Component Value Date   HGBA1C 5.3 07/22/2018   MPG 105 07/22/2018   MPG 100 07/21/2018   No results found for: PROLACTIN Lab Results  Component Value Date   CHOL 143 07/22/2018   TRIG 131 07/22/2018   HDL 30 (L) 07/22/2018   CHOLHDL 4.8 07/22/2018   VLDL 26 07/22/2018   LDLCALC 87 07/22/2018   LDLCALC 84 07/21/2018    See Psychiatric Specialty Exam and Suicide Risk Assessment completed by Attending Physician prior to discharge.  Discharge destination:  Home  Is patient on multiple antipsychotic therapies at discharge:  No   Has Patient had three or more failed trials of antipsychotic monotherapy by history:  No  Recommended Plan for Multiple Antipsychotic Therapies: NA  Discharge Instructions    Diet - low sodium heart healthy   Complete by:  As directed     Discharge instructions   Complete by:  As directed    Increase activity slowly   Complete by:  As directed      Allergies as of 09/04/2018      Reactions   Ibuprofen Nausea Only   Tramadol Nausea Only      Medication List    STOP taking these medications   nitrofurantoin (macrocrystal-monohydrate) 100 MG capsule Commonly known as:  MACROBID     TAKE these medications     Indication  albuterol 108 (90 Base) MCG/ACT inhaler Commonly known as:  PROVENTIL HFA;VENTOLIN HFA Inhale 2 puffs into the lungs every 6 (six) hours as needed for wheezing or shortness of breath.  Indication:  Asthma   sertraline 50 MG tablet Commonly known as:  ZOLOFT Take 1 tablet (50 mg total) by mouth daily.  Indication:  Major Depressive Disorder   traZODone 50 MG tablet Commonly known as:  DESYREL Take 1 tablet (50 mg total) by mouth at bedtime as needed for sleep.  Indication:  Trouble Sleeping      Follow-up Information    Services, [facility] Follow up.   Why:  Please call clinic within 24 hours of discharge to set up an hospital follow up appointment. At this time the appointment will be conducted over video chat. Contact information: [address] [place] [address] [phone]           Follow-up recommendations:  Activity:  as tolerated Diet:  heart healthy  Comments: Take all medications as prescribed. Keep all follow-up appointments as scheduled.  Do not consume alcohol or use illegal drugs while on prescription medications. Report any adverse effects from your medications to your primary care provider promptly.  In the event of recurrent symptoms or worsening symptoms, call 911, a crisis hotline, or go to the nearest emergency department for evaluation.   Signed: Oneta Rack, NP 09/04/2018, 9:35 AM   Patient seen, Suicide Assessment Completed.  Disposition Plan Reviewed

## 2018-09-04 NOTE — Progress Notes (Signed)
D: Pt A & O X 4. Denies SI, HI, AVH and pain at this time. Presents with flat affect and depressed mood. Brightened up when on phone. Reports her depression 1/10, anxiety 2/10 and hopelessness 0/10 on self inventory sheet. Pt's goal for today is to "work on d/c planning". D/C home as ordered. Cab voucher for [employer] given.  A: D/C instructions reviewed with pt including prescriptions, medication samples and follow up appointment; compliance encouraged. All belongings from locker # 17 given to pt at time of departure. Scheduled medications given with verbal education and effects monitored. Safety checks maintained without incident till time of d/c.  R: Pt receptive to care. Compliant with medications when offered. Denies adverse drug reactions when assessed. Verbalized understanding related to d/c instructions. Signed belonging sheet in agreement with items received from locker. Ambulatory with a steady gait. Appears to be in no physical distress at time of departure.

## 2018-10-01 ENCOUNTER — Telehealth: Payer: Self-pay | Admitting: Pharmacy Technician

## 2018-10-01 NOTE — Telephone Encounter (Signed)
Patient failed to provide requested financial documentation. Financial documentation is required in order to determine patient's eligibility for [facility]'s program. No additional medication assistance will be provided until patient provides requested financial documentation. Patient notified by letter.  Macyn Shropshire CPhT/Eligibility Specialist [facility]  

## 2019-01-17 ENCOUNTER — Emergency Department (HOSPITAL_COMMUNITY)
Admission: EM | Admit: 2019-01-17 | Discharge: 2019-01-17 | Disposition: A | Discharge status: Home / Self Care | Payer: Self-pay | Attending: Emergency Medicine | Admitting: Emergency Medicine

## 2019-01-17 ENCOUNTER — Encounter (HOSPITAL_COMMUNITY): Payer: Self-pay | Admitting: Emergency Medicine

## 2019-01-17 ENCOUNTER — Other Ambulatory Visit: Payer: Self-pay

## 2019-01-17 ENCOUNTER — Emergency Department (HOSPITAL_COMMUNITY): Payer: Self-pay

## 2019-01-17 DIAGNOSIS — I10 Essential (primary) hypertension: Secondary | ICD-10-CM | POA: Insufficient documentation

## 2019-01-17 DIAGNOSIS — R350 Frequency of micturition: Secondary | ICD-10-CM | POA: Insufficient documentation

## 2019-01-17 DIAGNOSIS — T7421XD Adult sexual abuse, confirmed, subsequent encounter: Secondary | ICD-10-CM | POA: Insufficient documentation

## 2019-01-17 DIAGNOSIS — J45909 Unspecified asthma, uncomplicated: Secondary | ICD-10-CM | POA: Insufficient documentation

## 2019-01-17 DIAGNOSIS — M545 Low back pain: Secondary | ICD-10-CM | POA: Insufficient documentation

## 2019-01-17 DIAGNOSIS — R112 Nausea with vomiting, unspecified: Secondary | ICD-10-CM | POA: Insufficient documentation

## 2019-01-17 DIAGNOSIS — R0789 Other chest pain: Secondary | ICD-10-CM | POA: Insufficient documentation

## 2019-01-17 DIAGNOSIS — F17210 Nicotine dependence, cigarettes, uncomplicated: Secondary | ICD-10-CM | POA: Insufficient documentation

## 2019-01-17 LAB — CBC
HCT: 45.9 % (ref 36.0–46.0)
Hemoglobin: 14.5 g/dL (ref 12.0–15.0)
MCH: 27.5 pg (ref 26.0–34.0)
MCHC: 31.6 g/dL (ref 30.0–36.0)
MCV: 87.1 fL (ref 80.0–100.0)
Platelets: 287 10*3/uL (ref 150–400)
RBC: 5.27 MIL/uL — ABNORMAL HIGH (ref 3.87–5.11)
RDW: 13.7 % (ref 11.5–15.5)
WBC: 12.5 10*3/uL — ABNORMAL HIGH (ref 4.0–10.5)
nRBC: 0 % (ref 0.0–0.2)

## 2019-01-17 LAB — TROPONIN I (HIGH SENSITIVITY)
Troponin I (High Sensitivity): <2 ng/L (ref <18)
Troponin I (High Sensitivity): 2 ng/L (ref <18)

## 2019-01-17 LAB — URINALYSIS, ROUTINE W REFLEX MICROSCOPIC
Bacteria, UA: NONE SEEN
Bilirubin Urine: NEGATIVE
Glucose, UA: NEGATIVE mg/dL
Hgb urine dipstick: NEGATIVE
Ketones, ur: NEGATIVE mg/dL
Nitrite: NEGATIVE
Protein, ur: NEGATIVE mg/dL
Specific Gravity, Urine: 1.009 (ref 1.005–1.030)
pH: 6 (ref 5.0–8.0)

## 2019-01-17 LAB — RAPID URINE DRUG SCREEN, HOSP PERFORMED
Amphetamines: NOT DETECTED
Barbiturates: NOT DETECTED
Benzodiazepines: NOT DETECTED
Cocaine: NOT DETECTED
Opiates: NOT DETECTED
Tetrahydrocannabinol: NOT DETECTED

## 2019-01-17 LAB — BASIC METABOLIC PANEL
Anion gap: 10 (ref 5–15)
BUN: 8 mg/dL (ref 6–20)
CO2: 26 mmol/L (ref 22–32)
Calcium: 9.6 mg/dL (ref 8.9–10.3)
Chloride: 102 mmol/L (ref 98–111)
Creatinine, Ser: 0.92 mg/dL (ref 0.44–1.00)
GFR calc Af Amer: >60 mL/min (ref >60)
GFR calc non Af Amer: >60 mL/min (ref >60)
Glucose, Bld: 95 mg/dL (ref 70–99)
Potassium: 3.9 mmol/L (ref 3.5–5.1)
Sodium: 138 mmol/L (ref 135–145)

## 2019-01-17 LAB — HCG, QUANTITATIVE, PREGNANCY: hCG, Beta Chain, Quant, S: 1 m[IU]/mL (ref <5)

## 2019-01-17 MED ORDER — CEFTRIAXONE SODIUM 250 MG IJ SOLR
250.0000 mg | Freq: Once | INTRAMUSCULAR | Status: AC
  Administered 2019-01-17: 250 mg via INTRAMUSCULAR
  Filled 2019-01-17: qty 250

## 2019-01-17 MED ORDER — AZITHROMYCIN 1 G PO PACK
1.0000 g | PACK | Freq: Once | ORAL | Status: AC
  Administered 2019-01-17: 1 g via ORAL
  Filled 2019-01-17: qty 1

## 2019-01-17 MED ORDER — STERILE WATER FOR INJECTION IJ SOLN
INTRAMUSCULAR | Status: AC
  Administered 2019-01-17: 14:00:00
  Filled 2019-01-17: qty 10

## 2019-01-17 MED ORDER — METRONIDAZOLE 500 MG PO TABS
2000.0000 mg | ORAL_TABLET | Freq: Once | ORAL | Status: AC
  Administered 2019-01-17: 2000 mg via ORAL
  Filled 2019-01-17: qty 4

## 2019-01-17 MED ORDER — SODIUM CHLORIDE 0.9% FLUSH: 3.0000 mL | Freq: Once | INTRAVENOUS | Status: DC

## 2019-01-17 MED ORDER — ONDANSETRON 4 MG PO TBDP
4.0000 mg | ORAL_TABLET | Freq: Once | ORAL | Status: AC
  Administered 2019-01-17: 4 mg via ORAL
  Filled 2019-01-17: qty 1

## 2019-01-17 NOTE — ED Notes (Signed)
Pt saw SANE RN, Garment/textile technologist, and PA. She has been discharged.

## 2019-01-17 NOTE — ED Notes (Signed)
Patient verbalizes understanding of discharge instructions. Opportunity for questioning and answers were provided. Armband removed by staff, pt discharged from ED.  

## 2019-01-17 NOTE — ED Notes (Signed)
Called lab and sent reqs for UDS and CG/chlamydia
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
## 2019-01-17 NOTE — ED Notes (Signed)
Pt is can be aroused,  but sleeping very soundly and snoring loudly.
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
## 2019-01-17 NOTE — ED Notes (Addendum)
Pt complains of chest pain, nausea, and urinary frequency. Pt reports this has been going on for 2-3 days now. Pt also reports going to [facility] this a.m. and leaving before being seen because she didn't want to wait any longer.
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
## 2019-01-19 LAB — RPR: RPR Ser Ql: NONREACTIVE

## 2019-01-19 LAB — HIV ANTIBODY (ROUTINE TESTING W REFLEX): HIV Screen 4th Generation wRfx: NONREACTIVE

## 2019-01-19 LAB — GC/CHLAMYDIA PROBE AMP (~~LOC~~) NOT AT ARMC
Chlamydia: NEGATIVE
Neisseria Gonorrhea: NEGATIVE

## 2019-05-09 ENCOUNTER — Other Ambulatory Visit: Payer: Self-pay

## 2019-05-09 ENCOUNTER — Emergency Department (HOSPITAL_COMMUNITY)
Admission: EM | Admit: 2019-05-09 | Discharge: 2019-05-09 | Disposition: A | Discharge status: Home / Self Care | Payer: Medicaid Other | Attending: Emergency Medicine | Admitting: Emergency Medicine

## 2019-05-09 ENCOUNTER — Encounter (HOSPITAL_COMMUNITY): Payer: Self-pay | Admitting: Emergency Medicine

## 2019-05-09 DIAGNOSIS — I10 Essential (primary) hypertension: Secondary | ICD-10-CM | POA: Insufficient documentation

## 2019-05-09 DIAGNOSIS — L03311 Cellulitis of abdominal wall: Secondary | ICD-10-CM | POA: Insufficient documentation

## 2019-05-09 DIAGNOSIS — J45909 Unspecified asthma, uncomplicated: Secondary | ICD-10-CM | POA: Insufficient documentation

## 2019-05-09 DIAGNOSIS — L0390 Cellulitis, unspecified: Secondary | ICD-10-CM

## 2019-05-09 DIAGNOSIS — F17210 Nicotine dependence, cigarettes, uncomplicated: Secondary | ICD-10-CM | POA: Insufficient documentation

## 2019-05-09 MED ORDER — SULFAMETHOXAZOLE-TRIMETHOPRIM 800-160 MG PO TABS: 1.0000 | ORAL_TABLET | Freq: Two times a day (BID) | ORAL | 0 refills | Status: DC

## 2019-05-09 NOTE — Discharge Instructions (Signed)
Soak area 20 minutes 4 times a day.  Take antibiotics as directed.  If redness and swelling persist you will need to remove peircing
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
## 2019-05-09 NOTE — ED Provider Notes (Signed)
[facility] EMERGENCY DEPARTMENT Provider Note   CSN: [id number] Arrival date & time: 05/09/19  1355     History   Chief Complaint Chief Complaint  Patient presents with  . Cellulitis    HPI Pamela Sanford is a 38 y.o. female.     Pt reports she got her bellybutton peirced for her birthday.  Pt complains of redness and irritation  No language interpreter was used.  Rash Location:  Torso Torso rash location: umbilicus. Quality: painful and redness   Pain details:    Quality:  Aching   Severity:  Moderate   Onset quality:  Gradual   Timing:  Constant Severity:  Moderate Timing:  Constant Progression:  Worsening Relieved by:  Nothing Worsened by:  Nothing Associated symptoms: no fever     Past Medical History:  Diagnosis Date  . Asthma    denies medications  . Cocaine abuse ([place])   . Depression   . Foot fracture   . GERD (gastroesophageal reflux disease)    denies medications  . Headache   . Hypertension     Patient Active Problem List   Diagnosis Date Noted  . Cocaine dependence with cocaine-induced mood disorder ([place])   . MDD (major depressive disorder), recurrent episode, severe ([place]) 09/02/2018  . 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 Cocaine abuse ([place]) 07/22/2018  . Severe recurrent major depression without psychotic features ([place]) 07/21/2018    Past Surgical History:  Procedure Laterality Date  . LAPAROSCOPIC TUBAL LIGATION    . MULTIPLE TOOTH EXTRACTIONS    . WISDOM TOOTH EXTRACTION       OB History    Gravida  2   Para  2   Term  2   Preterm      AB      Living  2     SAB      TAB      Ectopic      Multiple      Live Births               Home Medications    Prior to Admission medications   Medication Sig Start Date End Date Taking? Authorizing Provider  acetaminophen (TYLENOL) 650 MG CR tablet Take 650 mg by mouth every 8 (eight) hours as needed for pain.     [provider]  albuterol (PROVENTIL HFA;VENTOLIN HFA) 108 (90 Base) MCG/ACT inhaler Inhale 2 puffs into the lungs every 6 (six) hours as needed for wheezing or shortness of breath. 07/23/18   Clapacs, Madie Reno, MD  sertraline (ZOLOFT) 50 MG tablet Take 1 tablet (50 mg total) by mouth daily. Patient not taking: Reported on 01/17/2019 09/04/18   Derrill Center, NP  sulfamethoxazole-trimethoprim (BACTRIM DS) 800-160 MG tablet Take 1 tablet by mouth 2 (two) times daily. 05/09/19   Fransico Meadow, PA-C  traZODone (DESYREL) 50 MG tablet Take 1 tablet (50 mg total) by mouth at bedtime as needed for sleep. Patient not taking: Reported on 01/17/2019 09/04/18   Derrill Center, NP    Family History No family history on file.  Social History Social History   Tobacco Use  . Smoking status: Current Every Day Smoker    Packs/day: 1.00    Years: 8.00    Pack years: 8.00    Types: Cigarettes  . Smokeless tobacco: Never Used  Substance Use Topics  . Alcohol use: No  . Drug use: Not Currently  Types: Cocaine     Allergies   Ibuprofen and Tramadol   Review of Systems Review of Systems  Constitutional: Negative for fever.  Skin: Positive for rash.  All other systems reviewed and are negative.    Physical Exam Updated Vital Signs BP 107/70 (BP Location: Right Arm)   Pulse 99   Temp 98.3 F (36.8 C) (Oral)   Resp 18   Ht 5\' 2"  (1.575 m)   Wt 68 kg   LMP 05/09/2019   SpO2 100%   BMI 27.44 kg/m   Physical Exam Vitals signs and nursing note reviewed.  Constitutional:      Appearance: She is well-developed.  HENT:     Head: Normocephalic.  Neck:     Musculoskeletal: Normal range of motion.  Cardiovascular:     Rate and Rhythm: Normal rate.  Pulmonary:     Effort: Pulmonary effort is normal.  Abdominal:     General: There is no distension.     Comments: Erythema umbilicus at site of peircing   Neurological:     General: No focal deficit present.     Mental Status:  She is alert.  Psychiatric:        Mood and Affect: Mood normal.      ED Treatments / Results  Labs (all labs ordered are listed, but only abnormal results are displayed) Labs Reviewed - No data to display  EKG None  Radiology No results found.  Procedures Procedures (including critical care time)  Medications Ordered in ED Medications - No data to display   Initial Impression / Assessment and Plan / ED Course  I have reviewed the triage vital signs and the nursing notes.  Pertinent labs & imaging results that were available during my care of the patient were reviewed by me and considered in my medical decision making (see chart for details).        MDM Pt advised to soak area, Rx for Bactrim  Final Clinical Impressions(s) / ED Diagnoses   Final diagnoses:  Cellulitis, unspecified cellulitis site    ED Discharge Orders         Ordered    sulfamethoxazole-trimethoprim (BACTRIM DS) 800-160 MG tablet  2 times daily     05/09/19 1510        An After Visit Summary was printed and given to the patient.    14/06/20, Elson Areas 05/09/19 1520    14/06/20, MD 05/14/19 [phone]

## 2019-06-23 ENCOUNTER — Encounter (HOSPITAL_COMMUNITY): Payer: Self-pay

## 2019-06-23 ENCOUNTER — Emergency Department (HOSPITAL_COMMUNITY)
Admission: EM | Admit: 2019-06-23 | Discharge: 2019-06-23 | Disposition: A | Discharge status: Home / Self Care | Payer: Self-pay | Attending: Emergency Medicine | Admitting: Emergency Medicine

## 2019-06-23 ENCOUNTER — Other Ambulatory Visit: Payer: Self-pay

## 2019-06-23 ENCOUNTER — Emergency Department (HOSPITAL_COMMUNITY): Payer: Self-pay

## 2019-06-23 DIAGNOSIS — Z79899 Other long term (current) drug therapy: Secondary | ICD-10-CM | POA: Insufficient documentation

## 2019-06-23 DIAGNOSIS — M25511 Pain in right shoulder: Secondary | ICD-10-CM

## 2019-06-23 DIAGNOSIS — M5412 Radiculopathy, cervical region: Secondary | ICD-10-CM | POA: Insufficient documentation

## 2019-06-23 DIAGNOSIS — F17210 Nicotine dependence, cigarettes, uncomplicated: Secondary | ICD-10-CM | POA: Insufficient documentation

## 2019-06-23 DIAGNOSIS — M62838 Other muscle spasm: Secondary | ICD-10-CM | POA: Insufficient documentation

## 2019-06-23 DIAGNOSIS — I10 Essential (primary) hypertension: Secondary | ICD-10-CM | POA: Insufficient documentation

## 2019-06-23 DIAGNOSIS — J45909 Unspecified asthma, uncomplicated: Secondary | ICD-10-CM | POA: Insufficient documentation

## 2019-06-23 MED ORDER — OXYCODONE HCL 5 MG PO TABS
10.0000 mg | ORAL_TABLET | Freq: Once | ORAL | Status: AC
  Administered 2019-06-23: 10 mg via ORAL
  Filled 2019-06-23: qty 2

## 2019-06-23 MED ORDER — PREDNISONE 20 MG PO TABS: ORAL_TABLET | ORAL | 0 refills | Status: DC

## 2019-06-23 MED ORDER — METHOCARBAMOL 500 MG PO TABS: 500.0000 mg | ORAL_TABLET | Freq: Two times a day (BID) | ORAL | 0 refills | Status: DC

## 2019-06-23 MED ORDER — METHOCARBAMOL 500 MG PO TABS
500.0000 mg | ORAL_TABLET | Freq: Once | ORAL | Status: AC
  Administered 2019-06-23: 14:00:00 500 mg via ORAL
  Filled 2019-06-23: qty 1

## 2019-06-23 MED ORDER — DICLOFENAC SODIUM 1 % EX GEL: 2.0000 g | Freq: Four times a day (QID) | CUTANEOUS | 0 refills | Status: DC

## 2019-06-23 NOTE — ED Provider Notes (Signed)
[facility] EMERGENCY DEPARTMENT Provider Note   CSN: [id number] Arrival date & time: 06/23/19  1221     History Chief Complaint  Patient presents with  . Shoulder Pain    Pamela Sanford is a 39 y.o. female with a hx of asthma, substance abuse, GERD, hypertension presents to the Emergency Department complaining of gradual, persistent, progressively worsening right shoulder and neck pain onset approximately 3 days ago.  Patient denies known trauma or falls.  She reports gradual onset with associated muscle spasm.  She reports taking a friend's hydrocodone last night without relief.  No treatments this morning.  Patient reports she has had intermittent symptoms of paresthesias in the right hand but no weakness or numbness.  No symptoms like this in the past.  Movement makes her pain worse.  Heat does improve the pain some.  Patient does report previous trauma to the right arm more than 4 years ago but states no long-term complications.   The history is provided by the patient and medical records. No language interpreter was used.       Past Medical History:  Diagnosis Date  . Asthma    denies medications  . Cocaine abuse ([facility])   . Depression   . Foot fracture   . GERD (gastroesophageal reflux disease)    denies medications  . Headache   . Hypertension     Patient Active Problem List   Diagnosis Date Noted  . Cocaine dependence with cocaine-induced mood disorder ([facility])   . MDD (major depressive disorder), recurrent episode, severe ([facility]) 09/02/2018  . 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 Cocaine abuse ([facility]) 07/22/2018  . Severe recurrent major depression without psychotic features ([facility]) 07/21/2018    Past Surgical History:  Procedure Laterality Date  . LAPAROSCOPIC TUBAL LIGATION    . MULTIPLE TOOTH EXTRACTIONS    . WISDOM TOOTH EXTRACTION       OB History    Gravida  2   Para  2   Term  2   Preterm      AB      Living  2     SAB      TAB      Ectopic      Multiple      Live Births              No family history on file.  Social History   Tobacco Use  . Smoking status: Current Every Day Smoker    Packs/day: 1.00    Years: 8.00    Pack years: 8.00    Types: Cigarettes  . Smokeless tobacco: Never Used  Substance Use Topics  . Alcohol use: No  . Drug use: Not Currently    Types: Cocaine    Home Medications Prior to Admission medications   Medication Sig Start Date End Date Taking? Authorizing Provider  acetaminophen (TYLENOL) 650 MG CR tablet Take 650 mg by mouth every 8 (eight) hours as needed for pain.    [provider]  albuterol (PROVENTIL HFA;VENTOLIN HFA) 108 (90 Base) MCG/ACT inhaler Inhale 2 puffs into the lungs every 6 (six) hours as needed for wheezing or shortness of breath. 07/23/18   Clapacs, Jackquline Denmark, MD  diclofenac Sodium (VOLTAREN) 1 % GEL Apply 2 g topically 4 (four) times daily. 06/23/19   Nareg Breighner, Dahlia Client, PA-C  methocarbamol (ROBAXIN) 500 MG tablet Take 1 tablet (500 mg total) by mouth 2 (two) times daily. 06/23/19  Marvon Shillingburg, Dahlia Client, PA-C  predniSONE (DELTASONE) 20 MG tablet 3 tabs po daily x 3 days, then 2 tabs x 3 days, then 1.5 tabs x 3 days, then 1 tab x 3 days, then 0.5 tabs x 3 days 06/23/19   Deitra Craine, Dahlia Client, PA-C  sertraline (ZOLOFT) 50 MG tablet Take 1 tablet (50 mg total) by mouth daily. Patient not taking: Reported on 01/17/2019 09/04/18   Oneta Rack, NP  sulfamethoxazole-trimethoprim (BACTRIM DS) 800-160 MG tablet Take 1 tablet by mouth 2 (two) times daily. 05/09/19   Elson Areas, PA-C  traZODone (DESYREL) 50 MG tablet Take 1 tablet (50 mg total) by mouth at bedtime as needed for sleep. Patient not taking: Reported on 01/17/2019 09/04/18   Oneta Rack, NP    Allergies    Ibuprofen and Tramadol  Review of Systems   Review of Systems  Constitutional: Negative for chills and fever.  Gastrointestinal: Negative for nausea  and vomiting.  Musculoskeletal: Positive for arthralgias, joint swelling and neck pain. Negative for back pain and neck stiffness.  Skin: Negative for wound.  Neurological: Negative for numbness.  Hematological: Does not bruise/bleed easily.  Psychiatric/Behavioral: The patient is not nervous/anxious.   All other systems reviewed and are negative.   Physical Exam Updated Vital Signs BP 109/80 (BP Location: Left Arm)   Pulse (!) 104   Temp 98.1 F (36.7 C) (Oral)   Resp 17   Ht 5\' 2"  (1.575 m)   Wt 69.4 kg   LMP 06/03/2019   SpO2 100%   BMI 27.98 kg/m   Physical Exam Vitals and nursing note reviewed.  Constitutional:      General: She is not in acute distress.    Appearance: She is well-developed.  HENT:     Head: Normocephalic.  Eyes:     General: No scleral icterus.    Conjunctiva/sclera: Conjunctivae normal.  Cardiovascular:     Rate and Rhythm: Normal rate.  Pulmonary:     Effort: Pulmonary effort is normal.  Musculoskeletal:     Right shoulder: Tenderness present. Decreased range of motion (Almost complete range of motion). Normal strength.     Left shoulder: Normal.     Right elbow: Normal.     Left elbow: Normal.     Right wrist: Normal.     Left wrist: Normal.     Right hand: Normal.     Left hand: Normal.     Cervical back: Normal range of motion. Tenderness ( Right-sided paraspinal muscles) present. No rigidity or bony tenderness.     Thoracic back: Normal.       Back:  Skin:    General: Skin is warm and dry.  Neurological:     Mental Status: She is alert.     Comments: Sensation to normal touch intact to the bilateral upper extremities Strength 5/5 in the bilateral upper extremities     ED Results / Procedures / Treatments    Radiology DG Cervical Spine Complete  Result Date: 06/23/2019 CLINICAL DATA:  Right shoulder and neck pain 2 days.  No injury. EXAM: CERVICAL SPINE - COMPLETE 4+ VIEW COMPARISON:  None. FINDINGS: Vertebral body alignment  and heights are normal. There is mild spondylosis of the mid cervical spine. Minimal disc space narrowing at the C5-6 level. Prevertebral soft tissues are normal. No significant neural foraminal narrowing. Very minimal uncovertebral joint spurring. Atlantoaxial articulation is normal. IMPRESSION: No acute findings. Minimal spondylosis of the cervical spine with mild disc disease at the C5-6  level. Electronically Signed   By: Marin Olp M.D.   On: 06/23/2019 13:41   DG Shoulder Right  Result Date: 06/23/2019 CLINICAL DATA:  Right shoulder and neck pain 2 days.  No injury. EXAM: RIGHT SHOULDER - 2+ VIEW COMPARISON:  None. FINDINGS: There is no evidence of fracture or dislocation. There is no evidence of arthropathy or other focal bone abnormality. Soft tissues are unremarkable. IMPRESSION: Negative. Electronically Signed   By: Marin Olp M.D.   On: 06/23/2019 13:41    Procedures Procedures (including critical care time)  Medications Ordered in ED Medications  methocarbamol (ROBAXIN) tablet 500 mg (500 mg Oral Given 06/23/19 1348)  oxyCODONE (Oxy IR/ROXICODONE) immediate release tablet 10 mg (10 mg Oral Given 06/23/19 1349)    ED Course  I have reviewed the triage vital signs and the nursing notes.  Pertinent labs & imaging results that were available during my care of the patient were reviewed by me and considered in my medical decision making (see chart for details).  Clinical Course as of Jun 22 1426  Wed Jun 23, 2019  1426 Patient with tachycardia on triage.  No tachycardia on my exam and this has resolved after pain control.  Pulse Rate(!): 104 [HM]    Clinical Course User Index [HM] Pinki Rottman, Gwenlyn Perking   MDM Rules/Calculators/A&P                       Patient presents with posterior right shoulder pain and right-sided neck pain onset several days ago.  On exam she has almost complete range of motion of her right shoulder, full range of motion of her right elbow wrist and  hand.  She has no weakness or objective numbness.  No trauma.  Plain films show mild disc disease at C5-6.  No fractures.  No dislocation or subluxation of the right shoulder.  I personally evaluated these images.  Suspect this is the site of patient's radicular pain.  Pain control here in the emergency department with ability to fully range the right shoulder after pain control.  We will give prednisone, Voltaren gel and Robaxin.  Patient will need close primary care follow-up.  She is to return to emergency department for new or worsening symptoms or development of weakness.   Final Clinical Impression(s) / ED Diagnoses Final diagnoses:  Acute pain of right shoulder  Trapezius muscle spasm  Cervical radiculopathy    Rx / DC Orders ED Discharge Orders         Ordered    methocarbamol (ROBAXIN) 500 MG tablet  2 times daily     06/23/19 1403    diclofenac Sodium (VOLTAREN) 1 % GEL  4 times daily     06/23/19 1403    predniSONE (DELTASONE) 20 MG tablet     06/23/19 1403           Sophina Mitten, Gwenlyn Perking 06/23/19 1428    Milton Ferguson, MD 06/25/19 1050

## 2019-06-23 NOTE — ED Triage Notes (Signed)
Pt is having right shoulder and arm pains. She is not able to move her arm without pain. States she has shooting pains down to her hand with some numbness.

## 2019-06-23 NOTE — Discharge Instructions (Signed)
1. Medications: Robaxin, Voltaren, prednisone, usual home medications 2. Treatment: rest, drink plenty of fluids, gentle stretching, alternate ice and heat 3. Follow Up: Please followup with your primary doctor in 1 week for further evaluation of your symptoms; Please return to the ER for weakness in the right upper extremity, worsening pain, numbness or other concerns.

## 2019-08-11 ENCOUNTER — Emergency Department (HOSPITAL_COMMUNITY): Payer: Self-pay

## 2019-08-11 ENCOUNTER — Other Ambulatory Visit: Payer: Self-pay

## 2019-08-11 ENCOUNTER — Encounter (HOSPITAL_COMMUNITY): Payer: Self-pay

## 2019-08-11 ENCOUNTER — Emergency Department (HOSPITAL_COMMUNITY)
Admission: EM | Admit: 2019-08-11 | Discharge: 2019-08-11 | Disposition: A | Discharge status: Home / Self Care | Payer: Self-pay | Attending: Emergency Medicine | Admitting: Emergency Medicine

## 2019-08-11 DIAGNOSIS — J45909 Unspecified asthma, uncomplicated: Secondary | ICD-10-CM | POA: Insufficient documentation

## 2019-08-11 DIAGNOSIS — Z79899 Other long term (current) drug therapy: Secondary | ICD-10-CM | POA: Insufficient documentation

## 2019-08-11 DIAGNOSIS — F17210 Nicotine dependence, cigarettes, uncomplicated: Secondary | ICD-10-CM | POA: Insufficient documentation

## 2019-08-11 DIAGNOSIS — M25512 Pain in left shoulder: Secondary | ICD-10-CM | POA: Insufficient documentation

## 2019-08-11 DIAGNOSIS — I10 Essential (primary) hypertension: Secondary | ICD-10-CM | POA: Insufficient documentation

## 2019-08-11 MED ORDER — METHOCARBAMOL 500 MG PO TABS: 500.0000 mg | ORAL_TABLET | Freq: Three times a day (TID) | ORAL | 0 refills | Status: DC | PRN

## 2019-08-11 MED ORDER — NAPROXEN 250 MG PO TABS
375.0000 mg | ORAL_TABLET | Freq: Once | ORAL | Status: AC
  Administered 2019-08-11: 375 mg via ORAL
  Filled 2019-08-11: qty 2

## 2019-08-11 MED ORDER — NAPROXEN 500 MG PO TABS: 500.0000 mg | ORAL_TABLET | Freq: Two times a day (BID) | ORAL | 0 refills | Status: DC

## 2019-08-11 NOTE — Discharge Instructions (Signed)
Please read and follow all provided instructions.  You have been seen today for left shoulder pain.   Tests performed today include: An x-ray of the affected area - does NOT show any broken bones or dislocations.  Vital signs. See below for your results today.   Home care instructions: -- *PRICE in the first 24-48 hours after injury: Protect (with brace, splint, sling), if given by your provider Rest Ice- Do not apply ice pack directly to your skin, place towel or similar between your skin and ice/ice pack. Apply ice for 20 min, then remove for 40 min while awake Compression- Wear brace, elastic bandage, splint as directed by your provider Elevate affected extremity above the level of your heart when not walking around for the first 24-48 hours   Medications:  - Naproxen is a nonsteroidal anti-inflammatory medication that will help with pain and swelling. Be sure to take this medication as prescribed with food, 1 pill every 12 hours,  It should be taken with food, as it can cause stomach upset, and more seriously, stomach bleeding. Do not take other nonsteroidal anti-inflammatory medications with this such as Advil, Motrin, Aleve, Mobic, Goodie Powder, or Motrin.    - Robaxin is the muscle relaxer I have prescribed, this is meant to help with muscle tightness. Be aware that this medication may make you drowsy therefore the first time you take this it should be at a time you are in an environment where you can rest. Do not drive or operate heavy machinery when taking this medication. Do not drink alcohol or take other sedating medications with this medicine such as narcotics or benzodiazepines.   You make take Tylenol per over the counter dosing with these medications.   We have prescribed you new medication(s) today. Discuss the medications prescribed today with your pharmacist as they can have adverse effects and interactions with your other medicines including over the counter and prescribed  medications. Seek medical evaluation if you start to experience new or abnormal symptoms after taking one of these medicines, seek care immediately if you start to experience difficulty breathing, feeling of your throat closing, facial swelling, or rash as these could be indications of a more serious allergic reaction   Follow-up instructions: Please follow-up with your primary care provider or the provided orthopedic physician (bone specialist) if you continue to have significant pain in 1 week. In this case you may have a more severe injury that requires further care.   Return instructions:  Please return if your digits or extremity are numb or tingling, appear gray or blue, or you have severe pain (also elevate the extremity and loosen splint or wrap if you were given one) Please return if you have redness or fevers.  Please return to the Emergency Department if you experience worsening symptoms.  Please return if you have any other emergent concerns. Additional Information:  Your vital signs today were: BP 129/73 (BP Location: Right Arm)   Pulse 88   Temp 98.4 F (36.9 C) (Oral)   Resp 16   Ht 5\' 2"  (1.575 m)   Wt 68.5 kg   LMP 07/21/2019   SpO2 100%   BMI 27.62 kg/m  If your blood pressure (BP) was elevated above 135/85 this visit, please have this repeated by your doctor within one month. ---------------

## 2019-08-11 NOTE — ED Provider Notes (Signed)
[facility] EMERGENCY DEPARTMENT Provider Note   CSN: [id number] Arrival date & time: 08/11/19  1904     History Chief Complaint  Patient presents with  . Shoulder Pain    Pamela Sanford is a 39 y.o. female with a history of tobacco abuse, cocaine abuse, hypertension, asthma, and depression who presents to the emergency department with complaints of L shoulder pain for the past few days.  Patient states that a few days ago she was lifting a heavy trash can full of bottles when she felt acute onset of pain with a popping sensation to her left shoulder.  States has had discomfort to the left shoulder since then, constant, worse with movement, no alleviating factors.  Denies fever, chills, redness, warmth, numbness, tingling, or weakness.  HPI     Past Medical History:  Diagnosis Date  . Asthma    denies medications  . Cocaine abuse ([place])   . Depression   . Foot fracture   . GERD (gastroesophageal reflux disease)    denies medications  . Headache   . Hypertension     Patient Active Problem List   Diagnosis Date Noted  . Cocaine dependence with cocaine-induced mood disorder ([place])   . MDD (major depressive disorder), recurrent episode, severe ([place]) 09/02/2018  . 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 Cocaine abuse ([place]) 07/22/2018  . Severe recurrent major depression without psychotic features ([place]) 07/21/2018    Past Surgical History:  Procedure Laterality Date  . LAPAROSCOPIC TUBAL LIGATION    . MULTIPLE TOOTH EXTRACTIONS    . WISDOM TOOTH EXTRACTION       OB History    Gravida  2   Para  2   Term  2   Preterm      AB      Living  2     SAB      TAB      Ectopic      Multiple      Live Births              No family history on file.  Social History   Tobacco Use  . Smoking status: Current Every Day Smoker    Packs/day: 1.00    Years: 8.00    Pack years: 8.00    Types: Cigarettes  .  Smokeless tobacco: Never Used  Substance Use Topics  . Alcohol use: No  . Drug use: Not Currently    Types: Cocaine    Home Medications Prior to Admission medications   Medication Sig Start Date End Date Taking? Authorizing Provider  acetaminophen (TYLENOL) 650 MG CR tablet Take 650 mg by mouth every 8 (eight) hours as needed for pain.    [provider]  albuterol (PROVENTIL HFA;VENTOLIN HFA) 108 (90 Base) MCG/ACT inhaler Inhale 2 puffs into the lungs every 6 (six) hours as needed for wheezing or shortness of breath. 07/23/18   Clapacs, Madie Reno, MD  diclofenac Sodium (VOLTAREN) 1 % GEL Apply 2 g topically 4 (four) times daily. 06/23/19   Muthersbaugh, Jarrett Soho, PA-C  methocarbamol (ROBAXIN) 500 MG tablet Take 1 tablet (500 mg total) by mouth 2 (two) times daily. 06/23/19   Muthersbaugh, Jarrett Soho, PA-C  predniSONE (DELTASONE) 20 MG tablet 3 tabs po daily x 3 days, then 2 tabs x 3 days, then 1.5 tabs x 3 days, then 1 tab x 3 days, then 0.5 tabs x 3 days 06/23/19   Muthersbaugh, Jarrett Soho, PA-C  sertraline (ZOLOFT) 50 MG tablet Take 1 tablet (50 mg total) by mouth daily. Patient not taking: Reported on 01/17/2019 09/04/18   Oneta Rack, NP  sulfamethoxazole-trimethoprim (BACTRIM DS) 800-160 MG tablet Take 1 tablet by mouth 2 (two) times daily. 05/09/19   Elson Areas, PA-C  traZODone (DESYREL) 50 MG tablet Take 1 tablet (50 mg total) by mouth at bedtime as needed for sleep. Patient not taking: Reported on 01/17/2019 09/04/18   Oneta Rack, NP    Allergies    Ibuprofen and Tramadol  Review of Systems   Review of Systems  Constitutional: Negative for chills and fever.  Respiratory: Negative for cough and shortness of breath.   Gastrointestinal: Negative for nausea and vomiting.  Musculoskeletal: Positive for arthralgias. Negative for back pain and neck pain.  Neurological: Negative for weakness and numbness.    Physical Exam Updated Vital Signs BP 129/73 (BP Location: Right Arm)    Pulse 88   Temp 98.4 F (36.9 C) (Oral)   Resp 16   Ht 5\' 2"  (1.575 m)   Wt 68.5 kg   LMP 07/21/2019   SpO2 100%   BMI 27.62 kg/m   Physical Exam Vitals and nursing note reviewed.  Constitutional:      General: She is not in acute distress.    Appearance: Normal appearance. She is well-developed. She is not ill-appearing or toxic-appearing.  HENT:     Head: Normocephalic and atraumatic.  Eyes:     General:        Right eye: No discharge.        Left eye: No discharge.     Conjunctiva/sclera: Conjunctivae normal.  Neck:     Comments: No midline tenderness.  Cardiovascular:     Rate and Rhythm: Normal rate and regular rhythm.     Pulses:          Radial pulses are 2+ on the right side and 2+ on the left side.  Pulmonary:     Effort: Pulmonary effort is normal. No respiratory distress.     Breath sounds: Normal breath sounds. No wheezing, rhonchi or rales.  Chest:     Chest wall: Tenderness (Left anterior chest wall.) present.  Abdominal:     General: There is no distension.     Palpations: Abdomen is soft.     Tenderness: There is no abdominal tenderness.  Musculoskeletal:     Cervical back: Normal range of motion and neck supple.     Comments: Upper extremities: No obvious deformity, appreciable swelling, edema, erythema, ecchymosis, warmth, or open wounds. Patient has intact AROM throughout, pain with L shoulder flexion. LUE: tender to palpation to the diffuse glenohumeral joint, supraspinatus muscle, trapezius muscle with palpable muscle spasm, and to the left anterior chest wall.  Otherwise upper extremities are nontender. pain with left upper extremity empty can test, but has good strength. Back: No midline tenderness.  Skin:    General: Skin is warm and dry.     Capillary Refill: Capillary refill takes less than 2 seconds.     Findings: No rash.  Neurological:     Mental Status: She is alert.     Comments: Alert. Clear speech. Sensation grossly intact to bilateral  upper extremities. 5/5 symmetric grip strength. Ambulatory.   Psychiatric:        Mood and Affect: Mood normal.        Behavior: Behavior normal.     ED Results / Procedures / Treatments   Labs (all  labs ordered are listed, but only abnormal results are displayed) Labs Reviewed - No data to display  EKG None  Radiology DG Chest 2 View  Result Date: 08/11/2019 CLINICAL DATA:  Chest and shoulder pain. Left shoulder pain for 2 days. EXAM: CHEST - 2 VIEW COMPARISON:  Radiograph 01/17/2019 FINDINGS: The cardiomediastinal contours are normal. Mild central bronchial thickening. Pulmonary vasculature is normal. No consolidation, pleural effusion, or pneumothorax. No acute osseous abnormalities are seen. IMPRESSION: Mild central bronchial thickening can be seen with bronchitis or asthma. Electronically Signed   By: Narda Rutherford M.D.   On: 08/11/2019 20:39   DG Shoulder Left  Result Date: 08/11/2019 CLINICAL DATA:  Initial evaluation for acute left shoulder pain for 2 days. EXAM: LEFT SHOULDER - 2+ VIEW COMPARISON:  None. FINDINGS: There is no evidence of fracture or dislocation. There is no evidence of arthropathy or other focal bone abnormality. Soft tissues are unremarkable. IMPRESSION: Negative. Electronically Signed   By: Rise Mu M.D.   On: 08/11/2019 19:43    Procedures Procedures (including critical care time)  Medications Ordered in ED Medications - No data to display  ED Course  I have reviewed the triage vital signs and the nursing notes.  Pertinent labs & imaging results that were available during my care of the patient were reviewed by me and considered in my medical decision making (see chart for details).    MDM Rules/Calculators/A&P                      Patient presents to the emergency department with complaints of left shoulder pain status post heavy lifting with popping sensation and acute onset of discomfort.  Nontoxic, resting comfortably, vitals  WNL.  No signs of infection.  Range of motion preserved, but some pain with left shoulder flexion.  X-rays of affected areas without fracture or dislocation.  Patient is neurovascularly intact distally.  Question muscular in etiology versus rotator cuff pathology.  Will treat with naproxen and Robaxin with orthopedic follow-up. I discussed results, treatment plan, need for follow-up, and return precautions with the patient. Provided opportunity for questions, patient confirmed understanding and is in agreement with plan.   Final Clinical Impression(s) / ED Diagnoses Final diagnoses:  Acute pain of left shoulder    Rx / DC Orders ED Discharge Orders         Ordered    naproxen (NAPROSYN) 500 MG tablet  2 times daily     08/11/19 2113    methocarbamol (ROBAXIN) 500 MG tablet  Every 8 hours PRN     08/11/19 2113           Cherly Anderson, PA-C 08/11/19 2116    Milagros Loll, MD 08/12/19 1536

## 2019-08-11 NOTE — ED Triage Notes (Signed)
Pt presents to ED with complaints of left shoulder pain x 2 days. Pt states she has been lifting trash cans and states she felt and pop in her shoulder and a knot came up.

## 2019-10-10 ENCOUNTER — Encounter (HOSPITAL_COMMUNITY): Payer: Self-pay | Admitting: Emergency Medicine

## 2019-10-10 ENCOUNTER — Emergency Department (HOSPITAL_COMMUNITY)
Admission: EM | Admit: 2019-10-10 | Discharge: 2019-10-10 | Disposition: A | Discharge status: Home / Self Care | Payer: Medicaid Other | Attending: Emergency Medicine | Admitting: Emergency Medicine

## 2019-10-10 ENCOUNTER — Other Ambulatory Visit: Payer: Self-pay

## 2019-10-10 DIAGNOSIS — Z791 Long term (current) use of non-steroidal anti-inflammatories (NSAID): Secondary | ICD-10-CM | POA: Insufficient documentation

## 2019-10-10 DIAGNOSIS — Z79899 Other long term (current) drug therapy: Secondary | ICD-10-CM | POA: Insufficient documentation

## 2019-10-10 DIAGNOSIS — F17210 Nicotine dependence, cigarettes, uncomplicated: Secondary | ICD-10-CM | POA: Insufficient documentation

## 2019-10-10 DIAGNOSIS — R519 Headache, unspecified: Secondary | ICD-10-CM | POA: Insufficient documentation

## 2019-10-10 DIAGNOSIS — I10 Essential (primary) hypertension: Secondary | ICD-10-CM | POA: Insufficient documentation

## 2019-10-10 DIAGNOSIS — J45909 Unspecified asthma, uncomplicated: Secondary | ICD-10-CM | POA: Insufficient documentation

## 2019-10-10 DIAGNOSIS — H9203 Otalgia, bilateral: Secondary | ICD-10-CM | POA: Insufficient documentation

## 2019-10-10 MED ORDER — NAPROXEN 500 MG PO TABS: 500.0000 mg | ORAL_TABLET | Freq: Two times a day (BID) | ORAL | 0 refills | Status: DC

## 2019-10-10 MED ORDER — LIDOCAINE 5 % EX PTCH: 1.0000 | MEDICATED_PATCH | CUTANEOUS | 0 refills | Status: DC

## 2019-10-10 MED ORDER — KETOROLAC TROMETHAMINE 30 MG/ML IJ SOLN
15.0000 mg | Freq: Once | INTRAMUSCULAR | Status: AC
  Administered 2019-10-10: 15 mg via INTRAMUSCULAR
  Filled 2019-10-10: qty 1

## 2019-10-10 MED ORDER — FLUTICASONE PROPIONATE 50 MCG/ACT NA SUSP: 1.0000 | Freq: Every day | NASAL | 0 refills | Status: DC

## 2019-10-10 MED ORDER — METHOCARBAMOL 500 MG PO TABS: 500.0000 mg | ORAL_TABLET | Freq: Three times a day (TID) | ORAL | 0 refills | Status: DC | PRN

## 2019-10-10 NOTE — ED Triage Notes (Signed)
Pt reports 3 day history of headache and earache   She is noted to have very poor dental care with multiple caries   Here for eval

## 2019-10-10 NOTE — Discharge Instructions (Addendum)
You were seen in the emergency department today for a headache and ear pain.  We suspect your discomfort may be related to a spasm in the muscle of your neck and/or nasal congestion.  We are sending you home with the following medicines to help with your symptoms:  - Naproxen is a nonsteroidal anti-inflammatory medication that will help with pain and swelling. Be sure to take this medication as prescribed with food, 1 pill every 12 hours,  It should be taken with food, as it can cause stomach upset, and more seriously, stomach bleeding. Do not take other nonsteroidal anti-inflammatory medications with this such as Advil, Motrin, Aleve, Mobic, Goodie Powder, or Motrin.    - Robaxin is the muscle relaxer I have prescribed, this is meant to help with muscle tightness. Be aware that this medication may make you drowsy therefore the first time you take this it should be at a time you are in an environment where you can rest. Do not drive or operate heavy machinery when taking this medication. Do not drink alcohol or take other sedating medications with this medicine such as narcotics or benzodiazepines.   -Lidoderm patch: Please place 1 patch to the right side of your neck but it was uncomfortable during your emergency department exam today once per day, remove patch within 12 hours of application.  -Flonase: Please use 1 spray per nostril daily to help with nasal congestion which can lead to ear discomfort.  You make take Tylenol per over the counter dosing with these medications.   We have prescribed you new medication(s) today. Discuss the medications prescribed today with your pharmacist as they can have adverse effects and interactions with your other medicines including over the counter and prescribed medications. Seek medical evaluation if you start to experience new or abnormal symptoms after taking one of these medicines, seek care immediately if you start to experience difficulty breathing, feeling  of your throat closing, facial swelling, or rash as these could be indications of a more serious allergic reaction  Please follow-up with your primary care provider within 1 week for reevaluation.  Return to the ER for new or worsening symptoms including but not limited to worsening headache, sudden change in headache, change in your vision, numbness, weakness, fever, inability to move your neck, or any other concerns.

## 2019-10-10 NOTE — ED Provider Notes (Signed)
[facility] EMERGENCY DEPARTMENT Provider Note   CSN: [id number] Arrival date & time: 10/10/19  1417     History Chief Complaint  Patient presents with  . Headache  . Otalgia    Pamela Sanford is a 39 y.o. female with a history of tobacco abuse, hypertension, asthma, and depression who presents to the ED with complaints of headache for the past 3-4 days. Patient reports gradual onset pain to the generalized head that seems to be progressively worsening. She has associated ear discomfort bilaterally. Initially pain was more to the left side now notices it is worse on the right side. No specific alleviating/aggravating factors. She has tried Pamela Sanford without much relief. She has a history of similar headaches in the past. She denies fever, chills, neck stiffness, visual disturbance, numbness, weakness, dizziness, congestion, sore throat, dental pain, or cough. She has a history of cocaine use documented in her chart- denies illicit drug use recently. Denies recent head injury.  Patient denies chance of pregnancy, she is status post tubal ligation.  HPI     Past Medical History:  Diagnosis Date  . Asthma    denies medications  . Cocaine abuse ([facility])   . Depression   . Foot fracture   . GERD (gastroesophageal reflux disease)    denies medications  . Headache   . Hypertension     Patient Active Problem List   Diagnosis Date Noted  . Cocaine dependence with cocaine-induced mood disorder ([facility])   . MDD (major depressive disorder), recurrent episode, severe ([facility]) 09/02/2018  . 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 Cocaine abuse ([facility]) 07/22/2018  . Severe recurrent major depression without psychotic features ([facility]) 07/21/2018    Past Surgical History:  Procedure Laterality Date  . LAPAROSCOPIC TUBAL LIGATION    . MULTIPLE TOOTH EXTRACTIONS    . WISDOM TOOTH EXTRACTION       OB History    Gravida  2   Para  2   Term  2   Preterm      AB      Living  2     SAB      TAB      Ectopic      Multiple      Live Births              No family history on file.  Social History   Tobacco Use  . Smoking status: Current Every Day Smoker    Packs/day: 1.00    Years: 8.00    Pack years: 8.00    Types: Cigarettes  . Smokeless tobacco: Never Used  Substance Use Topics  . Alcohol use: No  . Drug use: Not Currently    Types: Cocaine    Home Medications Prior to Admission medications   Medication Sig Start Date End Date Taking? Authorizing Provider  acetaminophen ([place]) 650 MG CR tablet Take 650 mg by mouth every 8 (eight) hours as needed for pain.    [provider]  albuterol (PROVENTIL HFA;VENTOLIN HFA) 108 (90 Base) MCG/ACT inhaler Inhale 2 puffs into the lungs every 6 (six) hours as needed for wheezing or shortness of breath. 07/23/18   Clapacs, Jackquline Denmark, MD  diclofenac Sodium (VOLTAREN) 1 % GEL Apply 2 g topically 4 (four) times daily. 06/23/19   Muthersbaugh, Dahlia Client, PA-C  methocarbamol (ROBAXIN) 500 MG tablet Take 1 tablet (500 mg total) by mouth every 8 (eight) hours as needed for muscle  spasms. 08/11/19   Eilene Voigt R, PA-C  naproxen (NAPROSYN) 500 MG tablet Take 1 tablet (500 mg total) by mouth 2 (two) times daily. 08/11/19   Zoe Nordin R, PA-C  predniSONE (DELTASONE) 20 MG tablet 3 tabs po daily x 3 days, then 2 tabs x 3 days, then 1.5 tabs x 3 days, then 1 tab x 3 days, then 0.5 tabs x 3 days 06/23/19   Muthersbaugh, Jarrett Soho, PA-C  sulfamethoxazole-trimethoprim (BACTRIM DS) 800-160 MG tablet Take 1 tablet by mouth 2 (two) times daily. 05/09/19   Fransico Meadow, PA-C  sertraline (ZOLOFT) 50 MG tablet Take 1 tablet (50 mg total) by mouth daily. Patient not taking: Reported on 01/17/2019 09/04/18 08/11/19  Derrill Center, NP  traZODone (DESYREL) 50 MG tablet Take 1 tablet (50 mg total) by mouth at bedtime as needed for sleep. Patient not taking: Reported on 01/17/2019  09/04/18 08/11/19  Derrill Center, NP    Allergies    Ibuprofen and Tramadol  Review of Systems   Review of Systems  Constitutional: Negative for chills and fever.  HENT: Positive for ear pain. Negative for congestion, dental problem, drooling, ear discharge, facial swelling, hearing loss, sore throat and trouble swallowing.   Eyes: Negative for visual disturbance.  Respiratory: Negative for shortness of breath.   Cardiovascular: Negative for chest pain.  Gastrointestinal: Negative for abdominal pain, nausea and vomiting.  Musculoskeletal: Negative for neck stiffness.  Neurological: Positive for headaches. Negative for dizziness, seizures, syncope, facial asymmetry, speech difficulty, weakness, light-headedness and numbness.  All other systems reviewed and are negative.   Physical Exam Updated Vital Signs BP 110/69 (BP Location: Right Arm)   Pulse 92   Temp 98.1 F (36.7 C) (Oral)   Resp 18   Ht 5\' 2"  (1.575 m)   Wt 68 kg   LMP 09/15/2019 (Approximate)   SpO2 100%   BMI 27.44 kg/m   Physical Exam Vitals and nursing note reviewed.  Constitutional:      General: She is not in acute distress.    Appearance: She is well-developed. She is not toxic-appearing.  HENT:     Head: Normocephalic and atraumatic.     Right Ear: Ear canal normal. No drainage or swelling. Tympanic membrane is not perforated, erythematous, retracted or bulging.     Left Ear: Ear canal normal. No drainage or swelling. Tympanic membrane is not perforated, erythematous, retracted or bulging.     Ears:     Comments: No mastoid erythema/swelling/tenderness.     Nose: Mucosal edema and congestion present.     Right Sinus: No maxillary sinus tenderness or frontal sinus tenderness.     Left Sinus: No maxillary sinus tenderness or frontal sinus tenderness.     Mouth/Throat:     Pharynx: Uvula midline. No oropharyngeal exudate or posterior oropharyngeal erythema.     Comments: Posterior oropharynx is symmetric  appearing. Patient tolerating own secretions without difficulty. No trismus. No drooling. No hot potato voice. No swelling beneath the tongue, submandibular compartment is soft.  Eyes:     General: Vision grossly intact. Gaze aligned appropriately.        Right eye: No discharge.        Left eye: No discharge.     Extraocular Movements: Extraocular movements intact.     Conjunctiva/sclera: Conjunctivae normal.     Pupils: Pupils are equal, round, and reactive to light.     Comments: PERRL. No proptosis.   Cardiovascular:     Rate and  Rhythm: Normal rate and regular rhythm.     Heart sounds: No murmur.  Pulmonary:     Effort: Pulmonary effort is normal. No respiratory distress.     Breath sounds: Normal breath sounds. No wheezing, rhonchi or rales.  Abdominal:     General: There is no distension.     Palpations: Abdomen is soft.     Tenderness: There is no abdominal tenderness.  Musculoskeletal:     Cervical back: Normal range of motion and neck supple. No edema or rigidity. Muscular tenderness (right cervical paraspinal muscles ) present. No spinous process tenderness.  Lymphadenopathy:     Cervical: No cervical adenopathy.  Skin:    General: Skin is warm and dry.     Findings: No rash.  Neurological:     Mental Status: She is alert.     Comments: Alert. Clear speech. No facial droop. CNIII-XII grossly intact. Bilateral upper and lower extremities' sensation grossly intact. 5/5 symmetric strength with grip strength and with plantar and dorsi flexion bilaterally . Normal finger to nose bilaterally. Negative pronator drift. Negative Romberg sign. Gait is steady and intact.   Psychiatric:        Behavior: Behavior normal.    ED Results / Procedures / Treatments   Labs (all labs ordered are listed, but only abnormal results are displayed) Labs Reviewed - No data to display  EKG None  Radiology No results found.  Procedures Procedures (including critical care time)   Medications Ordered in ED Medications  ketorolac (TORADOL) 30 MG/ML injection 15 mg (has no administration in time range)     ED Course  I have reviewed the triage vital signs and the nursing notes.  Pertinent labs & imaging results that were available during my care of the patient were reviewed by me and considered in my medical decision making (see chart for details).    MDM Rules/Calculators/A&P                     Patient presents to the ED with complaints of headache & ear pain for the past few days.Patient is nontoxic, resting comfortably, vitals WNL. She does not have signs of AOM, AOE, or mastoiditis on exam.  She does have some nasal congestion/mucosal edema noted which may be contributing to the ear discomfort. Afebrile, no sinus tenderness, do not suspect acute bacterial sinusitis. No fever or nuchal rigidity to suggest meningitis. Hx of similar headache, gradual onset, hx of similar headaches, no focal neuro deficits, no visual disturbance, low suspicion for [facility], ICH, ischemic CVA, dural venous sinus thrombosis, acute glaucoma, giant cell arteritis, or mass. Given patient's pain is currently worse on the R side and she has pain to the R cervical paraspinal muscles, especially over the trapezius region this may be a tension headache related to muscle spasm. Will trial muscle relaxant, NSAID, lidoderm patch, and flonase. PCP follow up. I discussed treatment plan, need for follow-up, and return precautions with the patient. Provided opportunity for questions, patient confirmed understanding and is in agreement with plan.   Final Clinical Impression(s) / ED Diagnoses Final diagnoses:  Acute nonintractable headache, unspecified headache type    Rx / DC Orders ED Discharge Orders         Ordered    naproxen (NAPROSYN) 500 MG tablet  2 times daily     10/10/19 1456    lidocaine (LIDODERM) 5 %  Every 24 hours     10/10/19 1456    methocarbamol (ROBAXIN) 500  MG tablet  Every 8 hours  PRN     10/10/19 1456    fluticasone (FLONASE) 50 MCG/ACT nasal spray  Daily     10/10/19 1456           Shamiracle Gorden, Pleas Koch, PA-C 10/10/19 1458    Bethann Berkshire, MD 10/10/19 1500

## 2019-10-28 ENCOUNTER — Encounter: Payer: Self-pay | Admitting: Physician Assistant

## 2019-10-28 ENCOUNTER — Ambulatory Visit: Payer: Medicaid Other | Admitting: Physician Assistant

## 2019-10-28 VITALS — BP 106/70 | HR 87 | Temp 99.0°F | Ht 62.25 in | Wt 153.5 lb

## 2019-10-28 DIAGNOSIS — J329 Chronic sinusitis, unspecified: Secondary | ICD-10-CM

## 2019-10-28 DIAGNOSIS — F1491 Cocaine use, unspecified, in remission: Secondary | ICD-10-CM

## 2019-10-28 DIAGNOSIS — F418 Other specified anxiety disorders: Secondary | ICD-10-CM

## 2019-10-28 DIAGNOSIS — F17200 Nicotine dependence, unspecified, uncomplicated: Secondary | ICD-10-CM

## 2019-10-28 DIAGNOSIS — M549 Dorsalgia, unspecified: Secondary | ICD-10-CM

## 2019-10-28 DIAGNOSIS — Z7689 Persons encountering health services in other specified circumstances: Secondary | ICD-10-CM

## 2019-10-28 MED ORDER — AMOXICILLIN 500 MG PO CAPS: 500.0000 mg | ORAL_CAPSULE | Freq: Three times a day (TID) | ORAL | 0 refills | Status: DC

## 2019-10-28 NOTE — Patient Instructions (Signed)

## 2019-10-28 NOTE — Progress Notes (Signed)
BP 106/70   Pulse 87   Temp 99 F (37.2 C)   Ht 5' 2.25" (1.581 m)   Wt 153 lb 8 oz (69.6 kg)   SpO2 99%   BMI 27.85 kg/m    Subjective:    Patient ID: Pamela Sanford, female    DOB: 08/22/1980, 39 y.o.   MRN: [id number]  HPI: Pamela Sanford is a 39 y.o. female presenting on 10/28/2019 for New Patient (Initial Visit) (previous pt at [facility] until her husband decided to remove her from that practice. pt has been without PCP for about 2-3 months.), Headache (off and on for past 2 weeks. became constant about 2 days ago. pt states she has not woken up with HA but it starts through out the day. pt takes tylenol which eases pain.), and Ear Pain (bilateral ear pain)   HPI      Pt is a 39yoF who presents to office to establish care.  Pt was previously at [facility] and she says she liked it there but her husband did not so he made her stop going there.  Pt doesn't think there is anything usual with this.   Records from that office are not available at this time.  Epic review:  A1C, lipids normal - 07/22/18  No MH care in about 12 years.  Pt reports bad anxiety and some depression here and there.  No SI, HI.   Pt works at a [employer] in [place] is her 3rd day of working there.  She says PAP is not up to date.  She says it's been a few years.  Last one was with Dr Barrie Dunker (in [place])  Pt complains of headache- frontal and on top.  She says it's been Constant for last few days.   APAP eases the pain but not much  She complains of congestion as well  She also complains of B ear pain. For several days.  Pt had 1st covid vaccination shot .  She has 2nd dose scheduled on 11/14/19.    Pt says she was diagnosed with catacts as a teenager.  She doesn't remember the name of the eye doctor but says it was [facility] to [facility] in [place]  Pt has history of cocaine use but says she has not used any in about 2 months.  She is a smoker.   Relevant past  medical, surgical, family and social history reviewed and updated as indicated. Interim medical history since our last visit reviewed. Allergies and medications reviewed and updated.   Current Outpatient Medications:  .  acetaminophen (TYLENOL) 650 MG CR tablet, Take 650 mg by mouth every 8 (eight) hours as needed for pain., Disp: , Rfl:  .  albuterol (PROVENTIL HFA;VENTOLIN HFA) 108 (90 Base) MCG/ACT inhaler, Inhale 2 puffs into the lungs every 6 (six) hours as needed for wheezing or shortness of breath., Disp: 1 Inhaler, Rfl: 0 .  fluticasone (FLONASE) 50 MCG/ACT nasal spray, Place 1 spray into both nostrils daily., Disp: 16 g, Rfl: 0 .  lidocaine (LIDODERM) 5 %, Place 1 patch onto the skin daily. Place 1 patch directly over the right side of your neck where area was tender on exam in the emergency department.  Remove & Discard patch within 12 hours., Disp: 30 patch, Rfl: 0    Review of Systems  Per HPI unless specifically indicated above     Objective:    BP 106/70   Pulse 87  Temp 99 F (37.2 C)   Ht 5' 2.25" (1.581 m)   Wt 153 lb 8 oz (69.6 kg)   SpO2 99%   BMI 27.85 kg/m   Wt Readings from Last 3 Encounters:  10/28/19 153 lb 8 oz (69.6 kg)  10/10/19 150 lb (68 kg)  08/11/19 151 lb (68.5 kg)    Physical Exam Vitals reviewed.  Constitutional:      General: She is not in acute distress.    Appearance: She is well-developed. She is not ill-appearing.  HENT:     Head: Normocephalic and atraumatic.     Right Ear: Tympanic membrane, ear canal and external ear normal.     Left Ear: Ear canal and external ear normal.     Nose: Congestion and rhinorrhea present.     Mouth/Throat:     Dentition: Abnormal dentition. No dental abscesses.     Pharynx: No oropharyngeal exudate or posterior oropharyngeal erythema.     Comments: Poor dentition.  Few teeth remaining.  Many decayed.  Eyes:     Conjunctiva/sclera: Conjunctivae normal.     Pupils: Pupils are equal, round, and  reactive to light.  Neck:     Thyroid: No thyromegaly.  Cardiovascular:     Rate and Rhythm: Normal rate and regular rhythm.  Pulmonary:     Effort: Pulmonary effort is normal.     Breath sounds: Normal breath sounds.  Abdominal:     General: Bowel sounds are normal.     Palpations: Abdomen is soft. There is no mass.     Tenderness: There is no abdominal tenderness.  Musculoskeletal:     Cervical back: Neck supple.     Right lower leg: No edema.     Left lower leg: No edema.  Lymphadenopathy:     Cervical: No cervical adenopathy.  Skin:    General: Skin is warm and dry.  Neurological:     Mental Status: She is alert and oriented to person, place, and time.     Cranial Nerves: No facial asymmetry.     Sensory: Sensation is intact.     Motor: No weakness or tremor.     Gait: Gait is intact. Gait normal.     Deep Tendon Reflexes:     Reflex Scores:      Patellar reflexes are 2+ on the right side and 2+ on the left side. Psychiatric:        Behavior: Behavior normal.           Assessment & Plan:   Encounter Diagnoses  Name Primary?  . Encounter to establish care Yes  . Anxiety with depression   . Sinusitis, unspecified chronicity, unspecified location   . Tobacco use disorder   . Back pain, unspecified back location, unspecified back pain laterality, unspecified chronicity   . History of cocaine use       -No labs needed at this time -encouraged pt to go to [facility] for anxiety and depression -Pap report record request sent -rx amxoil for sinus.  rec flonase -After appt over/on way out door, pt mentions back pain that comes after she stands for a long time.  No pain in the back at other times.   Recommended back exercises and gave handout.   -discussed with pt that cataracts in children are very rare.  Recommend she update her eye exam with her eye professional -Pt to follow up in about 8 months.  She is to contact office sooenr prn

## 2019-11-05 ENCOUNTER — Emergency Department (HOSPITAL_COMMUNITY)
Admission: EM | Admit: 2019-11-05 | Discharge: 2019-11-05 | Disposition: A | Discharge status: Home / Self Care | Payer: Medicaid Other | Attending: Emergency Medicine | Admitting: Emergency Medicine

## 2019-11-05 ENCOUNTER — Other Ambulatory Visit: Payer: Self-pay

## 2019-11-05 ENCOUNTER — Encounter (HOSPITAL_COMMUNITY): Payer: Self-pay

## 2019-11-05 DIAGNOSIS — N939 Abnormal uterine and vaginal bleeding, unspecified: Secondary | ICD-10-CM | POA: Insufficient documentation

## 2019-11-05 DIAGNOSIS — Z5321 Procedure and treatment not carried out due to patient leaving prior to being seen by health care provider: Secondary | ICD-10-CM | POA: Insufficient documentation

## 2019-11-05 DIAGNOSIS — Z9851 Tubal ligation status: Secondary | ICD-10-CM | POA: Insufficient documentation

## 2019-11-05 DIAGNOSIS — R1030 Lower abdominal pain, unspecified: Secondary | ICD-10-CM | POA: Insufficient documentation

## 2019-11-05 NOTE — ED Triage Notes (Signed)
Pt reports lower abd pain, pressure in her vaginal area and vaginal bleeding that started yesterday.  Pt states it has been almost 2 months since her last period.  Pt states she has a tubal ligation.

## 2019-11-09 ENCOUNTER — Emergency Department (HOSPITAL_COMMUNITY)
Admission: EM | Admit: 2019-11-09 | Discharge: 2019-11-09 | Disposition: A | Discharge status: Home / Self Care | Payer: Medicaid Other | Attending: Emergency Medicine | Admitting: Emergency Medicine

## 2019-11-09 ENCOUNTER — Other Ambulatory Visit: Payer: Self-pay

## 2019-11-09 ENCOUNTER — Encounter (HOSPITAL_COMMUNITY): Payer: Self-pay

## 2019-11-09 DIAGNOSIS — R102 Pelvic and perineal pain: Secondary | ICD-10-CM | POA: Insufficient documentation

## 2019-11-09 DIAGNOSIS — R109 Unspecified abdominal pain: Secondary | ICD-10-CM | POA: Insufficient documentation

## 2019-11-09 DIAGNOSIS — Z5321 Procedure and treatment not carried out due to patient leaving prior to being seen by health care provider: Secondary | ICD-10-CM | POA: Insufficient documentation

## 2019-11-09 LAB — CBC
HCT: 38.7 % (ref 36.0–46.0)
Hemoglobin: 12.8 g/dL (ref 12.0–15.0)
MCH: 29.4 pg (ref 26.0–34.0)
MCHC: 33.1 g/dL (ref 30.0–36.0)
MCV: 88.8 fL (ref 80.0–100.0)
Platelets: 219 10*3/uL (ref 150–400)
RBC: 4.36 MIL/uL (ref 3.87–5.11)
RDW: 12.9 % (ref 11.5–15.5)
WBC: 9.9 10*3/uL (ref 4.0–10.5)
nRBC: 0 % (ref 0.0–0.2)

## 2019-11-09 LAB — URINALYSIS, ROUTINE W REFLEX MICROSCOPIC
Bilirubin Urine: NEGATIVE
Glucose, UA: NEGATIVE mg/dL
Hgb urine dipstick: NEGATIVE
Ketones, ur: NEGATIVE mg/dL
Leukocytes,Ua: NEGATIVE
Nitrite: NEGATIVE
Protein, ur: NEGATIVE mg/dL
Specific Gravity, Urine: 1.003 — ABNORMAL LOW (ref 1.005–1.030)
pH: 7 (ref 5.0–8.0)

## 2019-11-09 LAB — I-STAT BETA HCG BLOOD, ED (MC, WL, AP ONLY): I-stat hCG, quantitative: <5 m[IU]/mL (ref <5)

## 2019-11-09 NOTE — ED Triage Notes (Signed)
Pt arrives POV for eval of abd pain, pelvic pain/pressure x 3 days. Was seen at [facility] for same on 6/4. Denies change of preg, states she missed period in May, but did have one this month. Endorses episodes of N/V at night

## 2019-11-10 LAB — COMPREHENSIVE METABOLIC PANEL
ALT: 10 U/L (ref 0–44)
AST: 13 U/L — ABNORMAL LOW (ref 15–41)
Albumin: 3.8 g/dL (ref 3.5–5.0)
Alkaline Phosphatase: 52 U/L (ref 38–126)
Anion gap: 10 (ref 5–15)
BUN: 5 mg/dL — ABNORMAL LOW (ref 6–20)
CO2: 22 mmol/L (ref 22–32)
Calcium: 8.7 mg/dL — ABNORMAL LOW (ref 8.9–10.3)
Chloride: 110 mmol/L (ref 98–111)
Creatinine, Ser: 0.83 mg/dL (ref 0.44–1.00)
GFR calc Af Amer: >60 mL/min (ref >60)
GFR calc non Af Amer: >60 mL/min (ref >60)
Glucose, Bld: 85 mg/dL (ref 70–99)
Potassium: 3.5 mmol/L (ref 3.5–5.1)
Sodium: 142 mmol/L (ref 135–145)
Total Bilirubin: 0.4 mg/dL (ref 0.3–1.2)
Total Protein: 6.6 g/dL (ref 6.5–8.1)

## 2019-11-10 LAB — LIPASE, BLOOD: Lipase: 82 U/L — ABNORMAL HIGH (ref 11–51)

## 2019-12-27 IMAGING — DX DG CHEST 2V
2 series · 2 of 2 positions shown · non-contrast
Comparison: 03/01/2017

CLINICAL DATA: Left chest pain radiating to the left shoulder for 1
day. History of asthma. Current smoker.

EXAM:
CHEST - 2 VIEW

[chest pa]
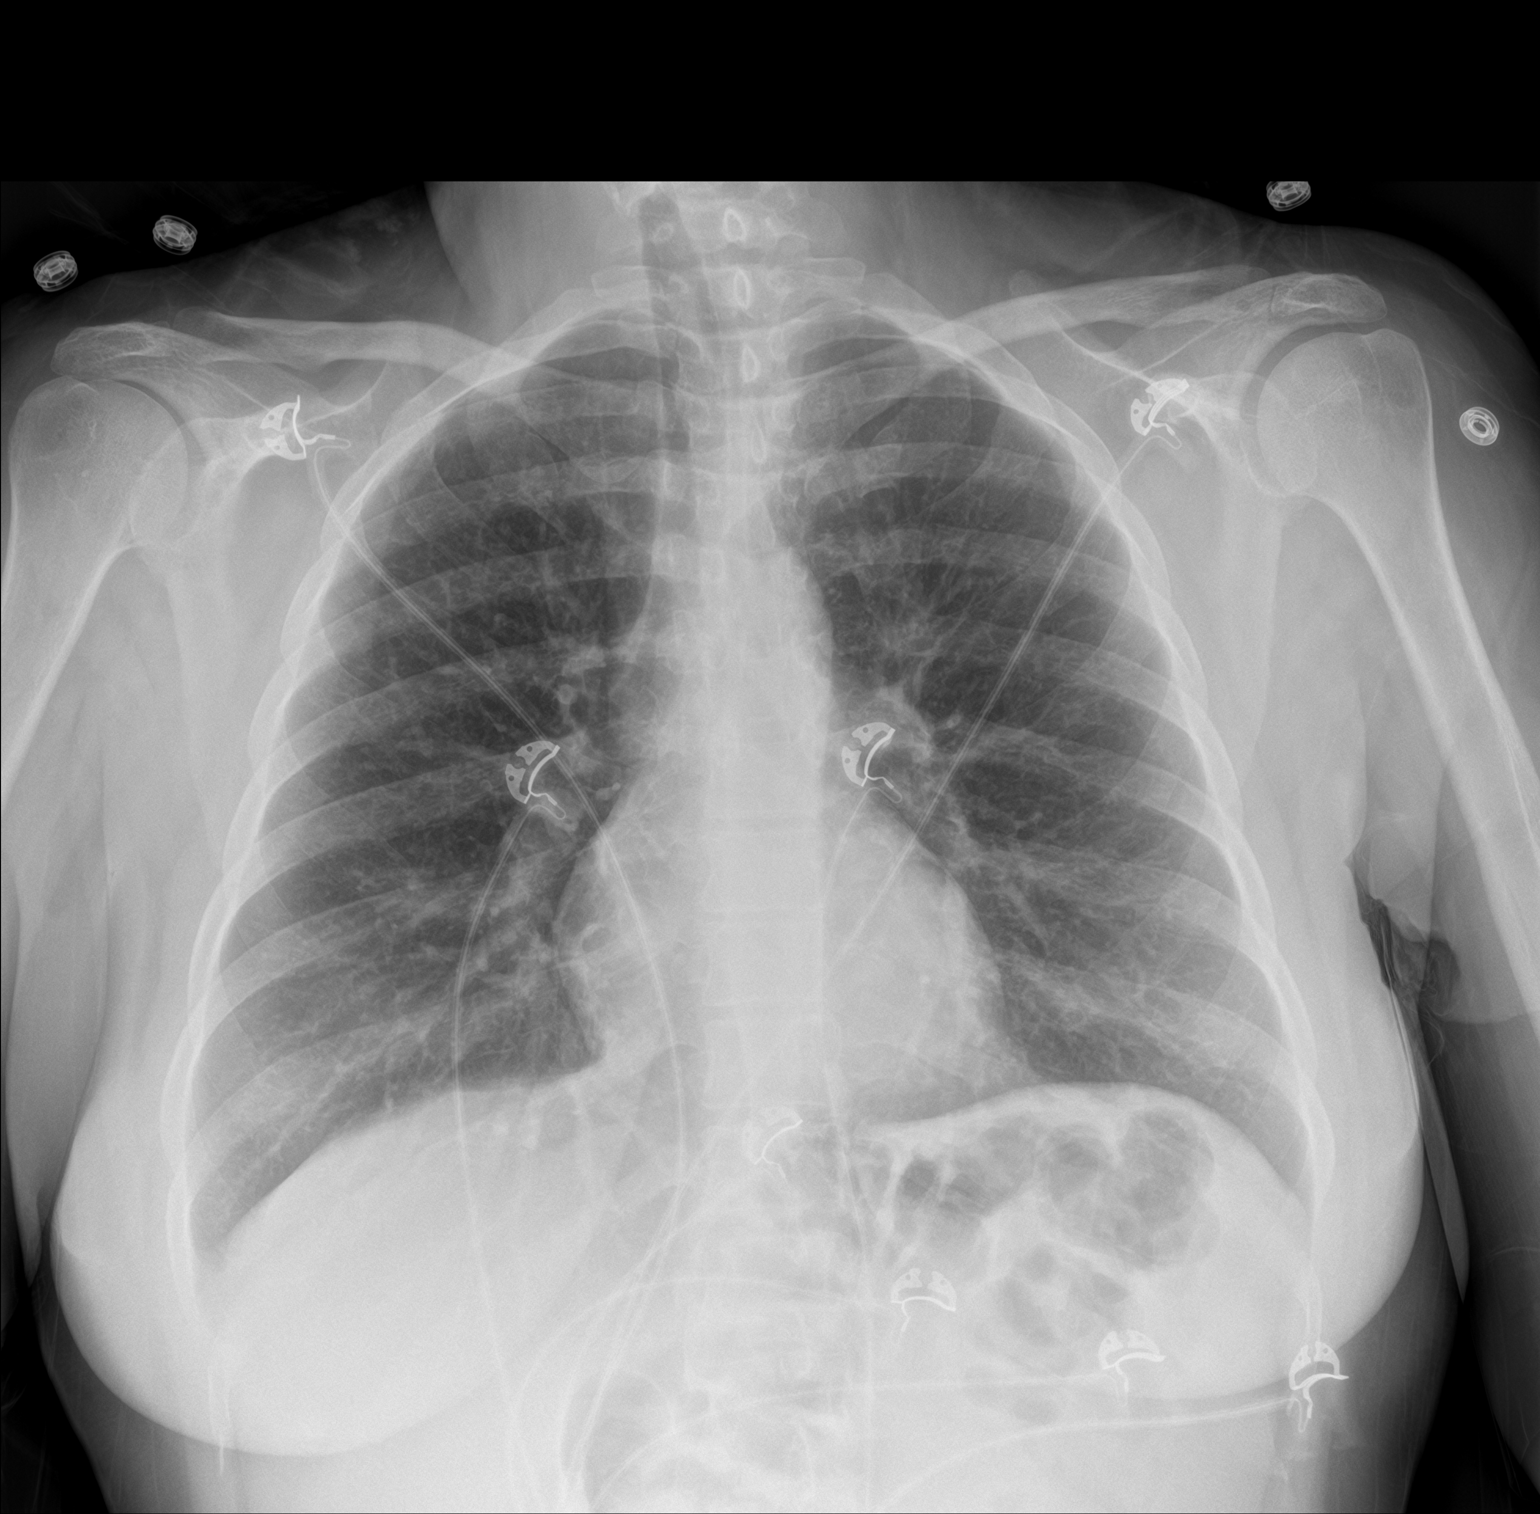

[chest lat]
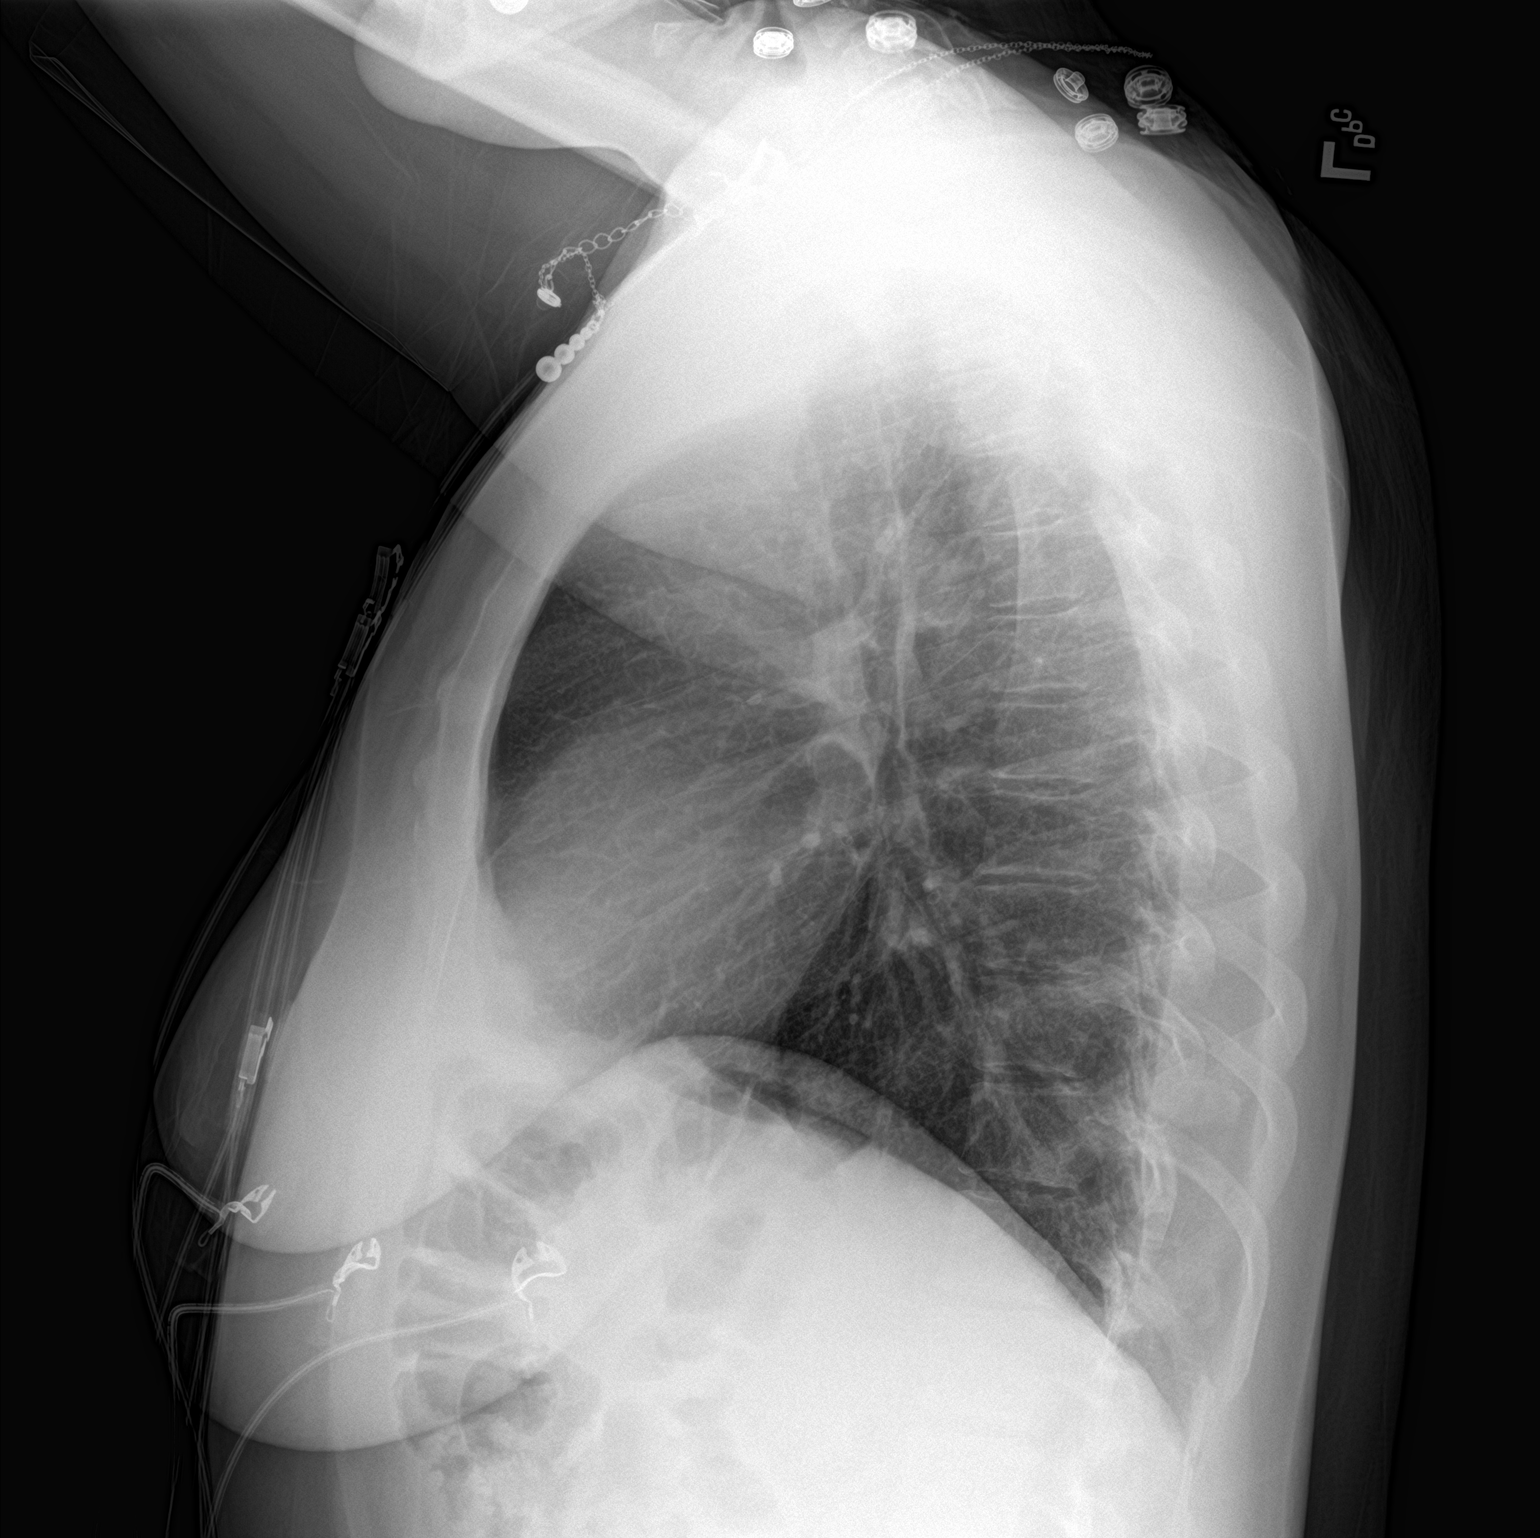

[2 of 2 positions shown; findings below may reference images not displayed]

FINDINGS: Mild hyperinflation with central peribronchial thickening compatible
with history of asthma. Acute or chronic bronchitis could also have
this appearance. No blunting of costophrenic angles. No
pneumothorax. No focal consolidation. Heart size and pulmonary
vascularity are normal. Mediastinal contours appear intact.
IMPRESSION: Hyperinflation with central peribronchial thickening compatible with
history of asthma. No focal consolidation.

## 2020-01-05 IMAGING — DX DG CHEST 2V
2 series · 2 of 2 positions shown · non-contrast
Comparison: 07/28/2018

CLINICAL DATA: persistent productive cough, sob, general chest
pain, and nasal drainage x 2 weeks. Pt also reports bilateral
intermittent lower extremity numbness x 3-4 days. History of asthma,
gerd.

EXAM:
CHEST - 2 VIEW

[chest pa]
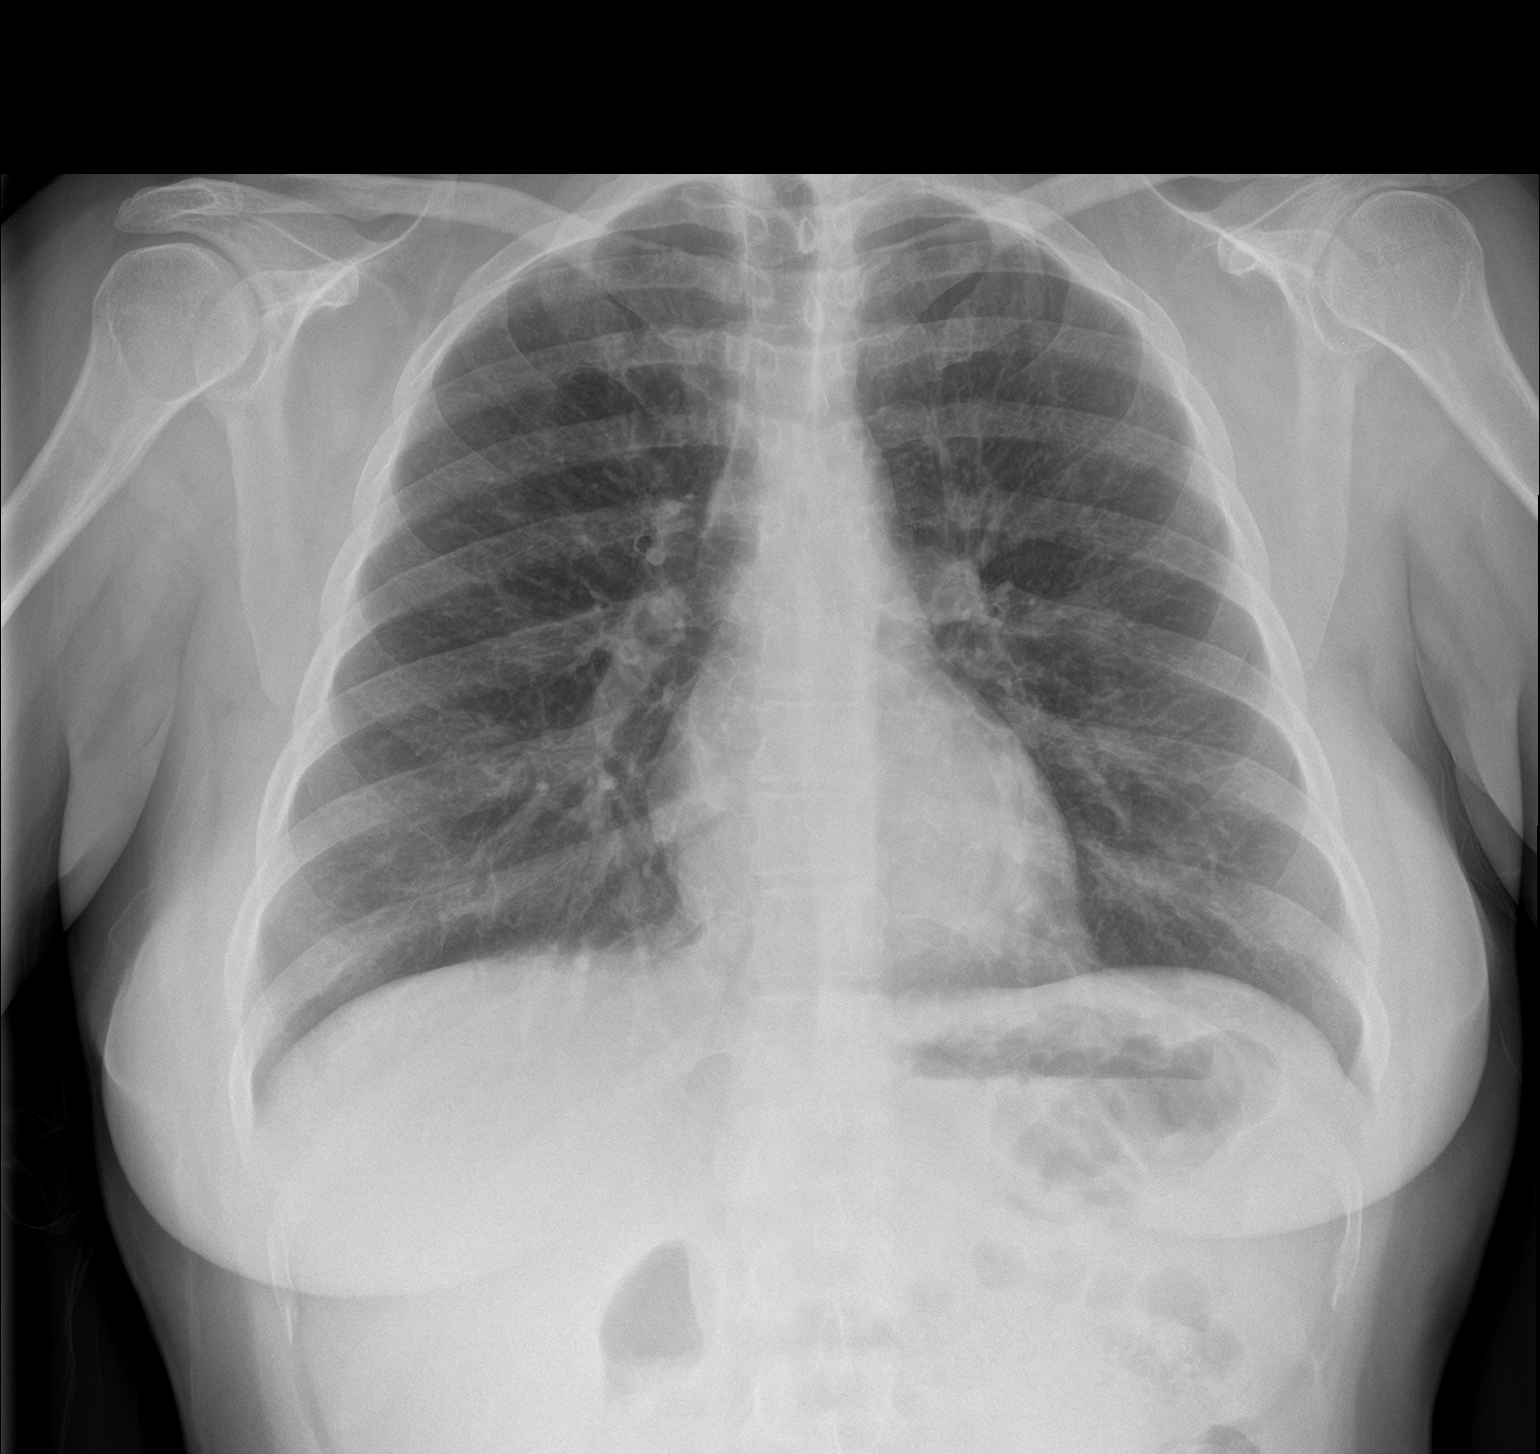

[chest lat]
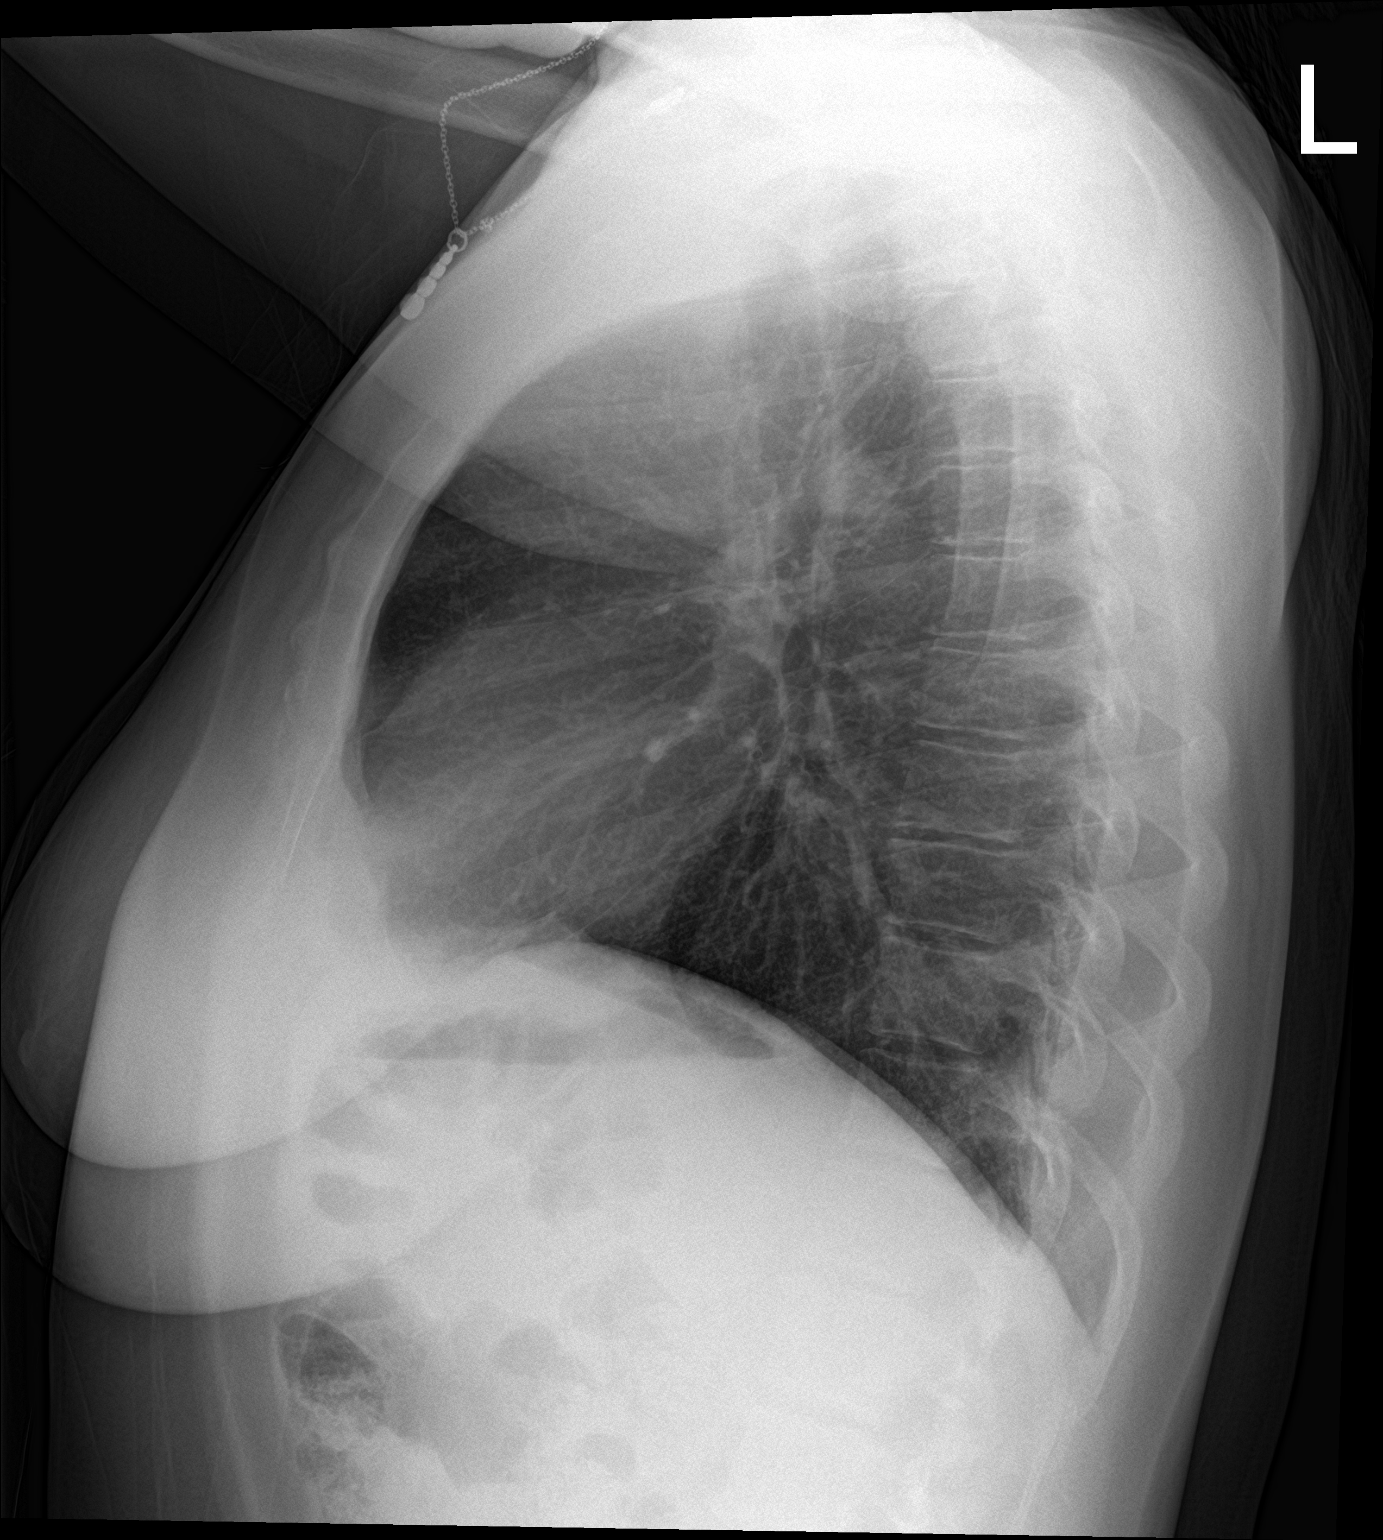

[2 of 2 positions shown; findings below may reference images not displayed]

FINDINGS: The heart size and mediastinal contours are within normal limits.
Both lungs are clear. No pleural effusion or pneumothorax. The
visualized skeletal structures are unremarkable.
IMPRESSION: No active cardiopulmonary disease.

## 2020-01-08 IMAGING — DX DG CHEST 2V
2 series · 2 of 2 positions shown · non-contrast
Comparison: Three days ago

CLINICAL DATA: Nausea

EXAM:
CHEST - 2 VIEW

[chest pa]
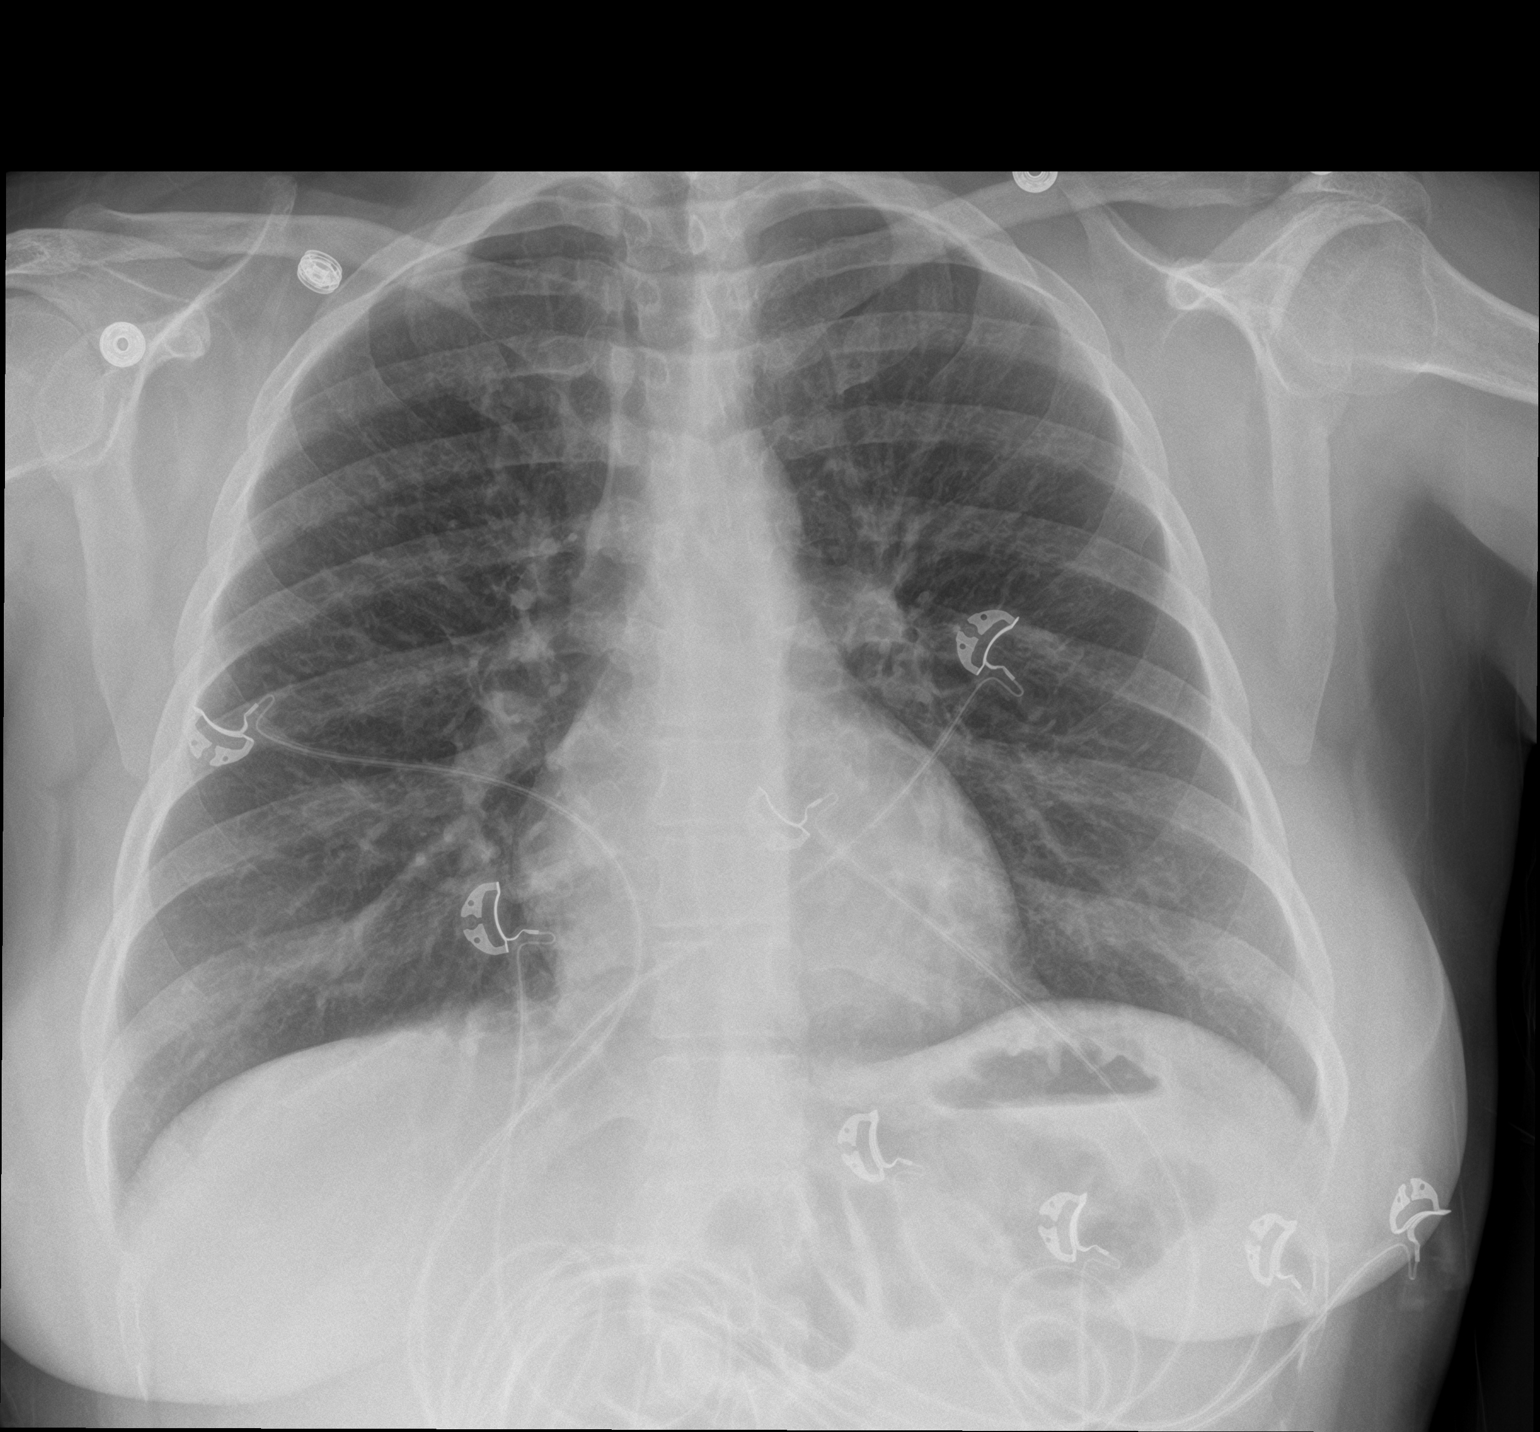

[chest lat]
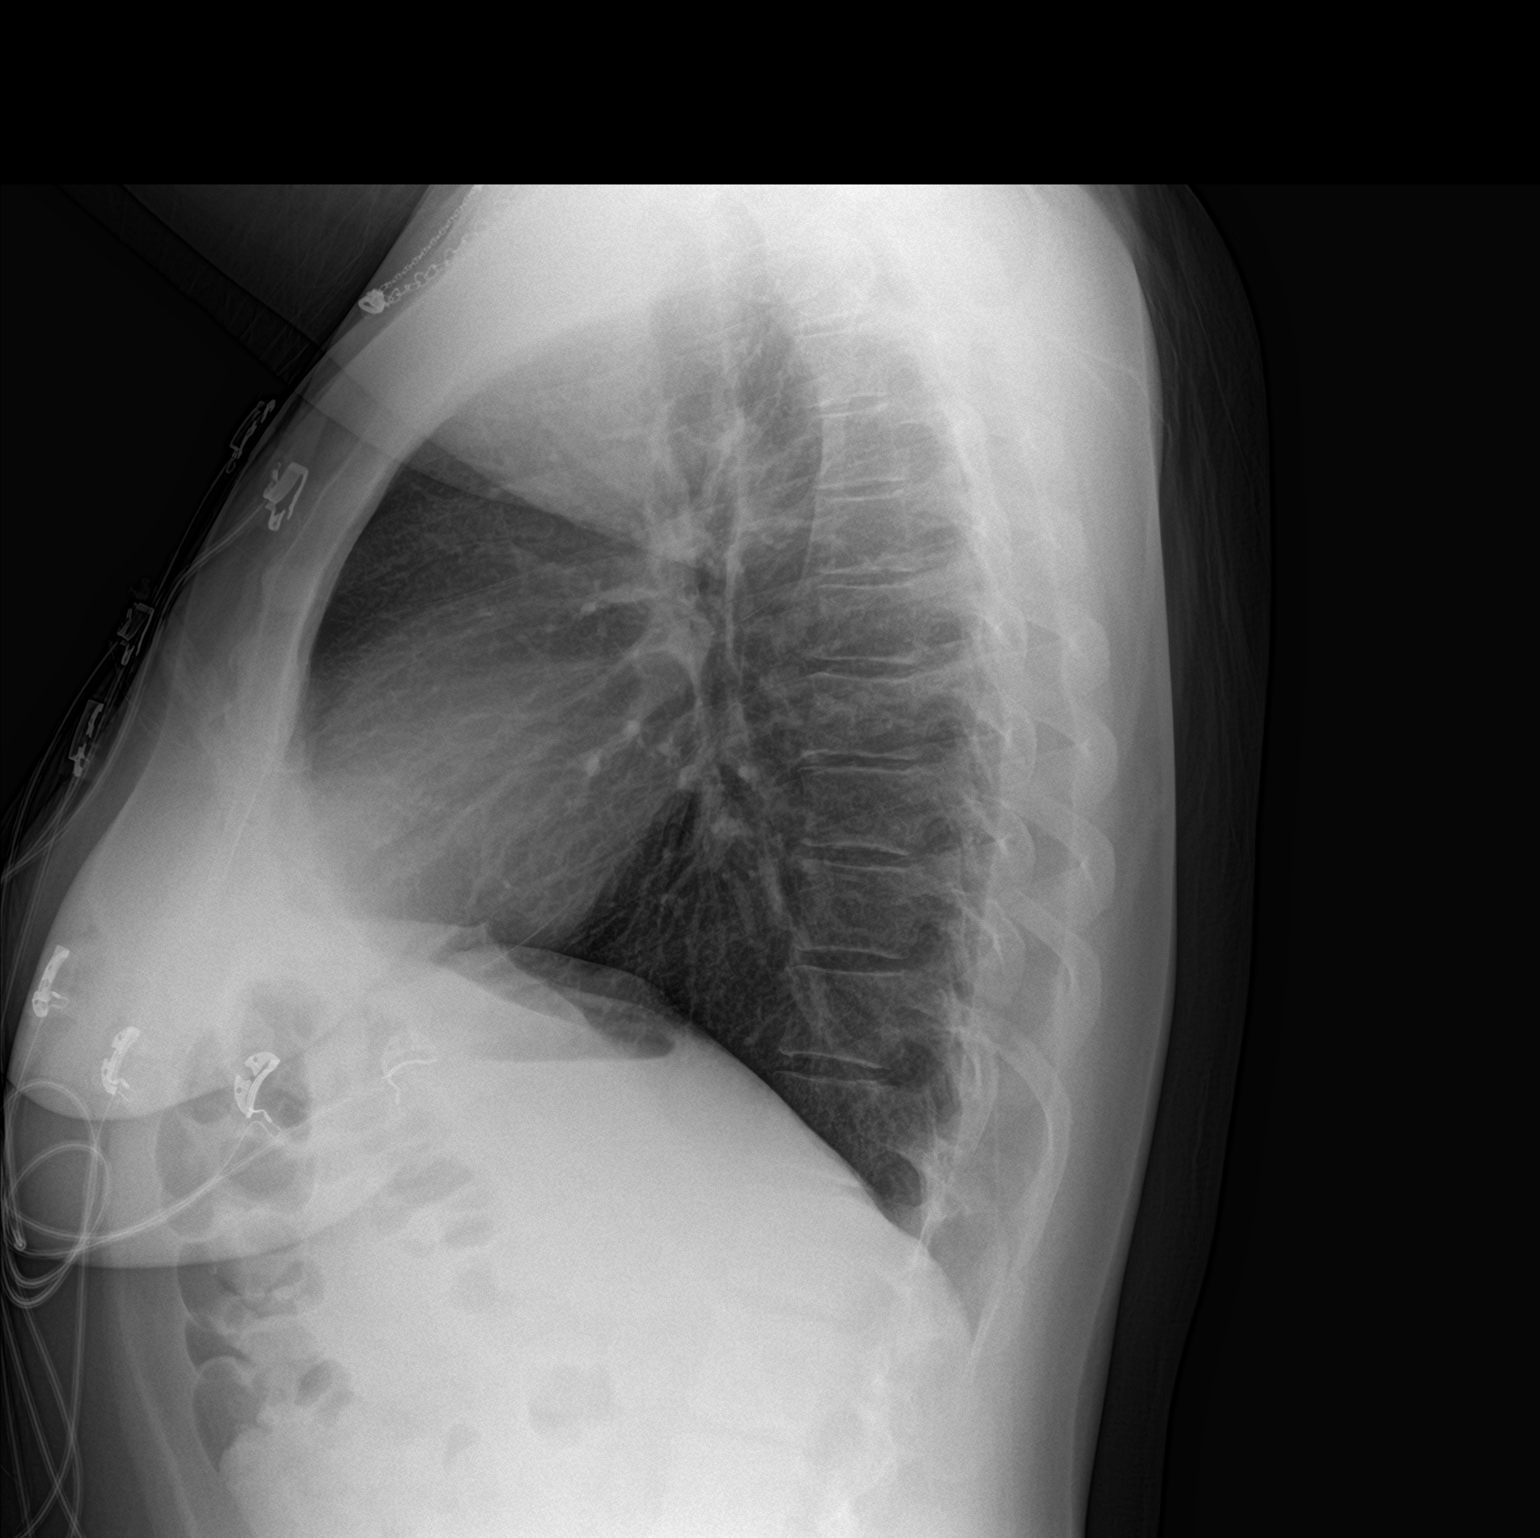

[2 of 2 positions shown; findings below may reference images not displayed]

FINDINGS: Normal heart size and mediastinal contours. There is no edema,
consolidation, effusion, or pneumothorax. No osseous findings
IMPRESSION: Stable exam.  No evidence of active disease.

## 2020-02-15 ENCOUNTER — Other Ambulatory Visit: Payer: Medicaid Other

## 2020-02-15 ENCOUNTER — Other Ambulatory Visit: Payer: Self-pay | Admitting: Physician Assistant

## 2020-02-15 DIAGNOSIS — Z20822 Contact with and (suspected) exposure to covid-19: Secondary | ICD-10-CM

## 2020-02-17 LAB — SARS-COV-2, NAA 2 DAY TAT

## 2020-02-17 LAB — SPECIMEN STATUS REPORT

## 2020-02-17 LAB — NOVEL CORONAVIRUS, NAA: SARS-CoV-2, NAA: NOT DETECTED

## 2020-03-22 ENCOUNTER — Other Ambulatory Visit: Payer: Self-pay

## 2020-03-22 ENCOUNTER — Other Ambulatory Visit: Payer: Self-pay | Admitting: *Deleted

## 2020-03-22 DIAGNOSIS — Z20822 Contact with and (suspected) exposure to covid-19: Secondary | ICD-10-CM

## 2020-03-23 LAB — SPECIMEN STATUS REPORT

## 2020-03-23 LAB — SARS-COV-2, NAA 2 DAY TAT

## 2020-03-23 LAB — NOVEL CORONAVIRUS, NAA: SARS-CoV-2, NAA: NOT DETECTED

## 2020-07-10 ENCOUNTER — Ambulatory Visit: Payer: Medicaid Other | Admitting: Physician Assistant

## 2020-07-12 ENCOUNTER — Encounter: Payer: Self-pay | Admitting: Physician Assistant

## 2020-10-12 ENCOUNTER — Telehealth: Payer: Self-pay | Admitting: Physician Assistant

## 2020-10-12 NOTE — Telephone Encounter (Signed)
Record request sent 10/28/2019 to Dr Mora Appl fax # [phone] for most recent PAP.  Reply from that facility- no PAP on file

## 2021-08-17 ENCOUNTER — Emergency Department (HOSPITAL_COMMUNITY)
Admission: EM | Admit: 2021-08-17 | Discharge: 2021-08-17 | Disposition: A | Discharge status: Home / Self Care | Payer: Self-pay | Attending: Emergency Medicine | Admitting: Emergency Medicine

## 2021-08-17 ENCOUNTER — Emergency Department (HOSPITAL_COMMUNITY): Payer: Self-pay

## 2021-08-17 ENCOUNTER — Encounter (HOSPITAL_COMMUNITY): Payer: Self-pay

## 2021-08-17 ENCOUNTER — Other Ambulatory Visit: Payer: Self-pay

## 2021-08-17 DIAGNOSIS — Z20822 Contact with and (suspected) exposure to covid-19: Secondary | ICD-10-CM | POA: Insufficient documentation

## 2021-08-17 DIAGNOSIS — J40 Bronchitis, not specified as acute or chronic: Secondary | ICD-10-CM | POA: Insufficient documentation

## 2021-08-17 DIAGNOSIS — Z7951 Long term (current) use of inhaled steroids: Secondary | ICD-10-CM | POA: Insufficient documentation

## 2021-08-17 DIAGNOSIS — F17200 Nicotine dependence, unspecified, uncomplicated: Secondary | ICD-10-CM | POA: Insufficient documentation

## 2021-08-17 LAB — COMPREHENSIVE METABOLIC PANEL
ALT: 14 U/L (ref 0–44)
AST: 18 U/L (ref 15–41)
Albumin: 3.9 g/dL (ref 3.5–5.0)
Alkaline Phosphatase: 52 U/L (ref 38–126)
Anion gap: 7 (ref 5–15)
BUN: 5 mg/dL — ABNORMAL LOW (ref 6–20)
CO2: 26 mmol/L (ref 22–32)
Calcium: 9.2 mg/dL (ref 8.9–10.3)
Chloride: 109 mmol/L (ref 98–111)
Creatinine, Ser: 0.83 mg/dL (ref 0.44–1.00)
GFR, Estimated: >60 mL/min (ref >60)
Glucose, Bld: 88 mg/dL (ref 70–99)
Potassium: 4.4 mmol/L (ref 3.5–5.1)
Sodium: 142 mmol/L (ref 135–145)
Total Bilirubin: 0.3 mg/dL (ref 0.3–1.2)
Total Protein: 6.8 g/dL (ref 6.5–8.1)

## 2021-08-17 LAB — URINALYSIS, ROUTINE W REFLEX MICROSCOPIC
Bilirubin Urine: NEGATIVE
Glucose, UA: NEGATIVE mg/dL
Hgb urine dipstick: NEGATIVE
Ketones, ur: NEGATIVE mg/dL
Nitrite: NEGATIVE
Protein, ur: NEGATIVE mg/dL
Specific Gravity, Urine: 1.005 (ref 1.005–1.030)
pH: 7 (ref 5.0–8.0)

## 2021-08-17 LAB — RESP PANEL BY RT-PCR (FLU A&B, COVID) ARPGX2
Influenza A by PCR: NEGATIVE
Influenza B by PCR: NEGATIVE
SARS Coronavirus 2 by RT PCR: NEGATIVE

## 2021-08-17 LAB — CBC WITH DIFFERENTIAL/PLATELET
Abs Immature Granulocytes: 0.03 10*3/uL (ref 0.00–0.07)
Basophils Absolute: 0.1 10*3/uL (ref 0.0–0.1)
Basophils Relative: 1 %
Eosinophils Absolute: 0.2 10*3/uL (ref 0.0–0.5)
Eosinophils Relative: 2 %
HCT: 43.1 % (ref 36.0–46.0)
Hemoglobin: 13.5 g/dL (ref 12.0–15.0)
Immature Granulocytes: 0 %
Lymphocytes Relative: 19 %
Lymphs Abs: 2 10*3/uL (ref 0.7–4.0)
MCH: 28.6 pg (ref 26.0–34.0)
MCHC: 31.3 g/dL (ref 30.0–36.0)
MCV: 91.3 fL (ref 80.0–100.0)
Monocytes Absolute: 0.5 10*3/uL (ref 0.1–1.0)
Monocytes Relative: 5 %
Neutro Abs: 8.1 10*3/uL — ABNORMAL HIGH (ref 1.7–7.7)
Neutrophils Relative %: 73 %
Platelets: 244 10*3/uL (ref 150–400)
RBC: 4.72 MIL/uL (ref 3.87–5.11)
RDW: 13.2 % (ref 11.5–15.5)
WBC: 10.8 10*3/uL — ABNORMAL HIGH (ref 4.0–10.5)
nRBC: 0 % (ref 0.0–0.2)

## 2021-08-17 LAB — I-STAT BETA HCG BLOOD, ED (MC, WL, AP ONLY): I-stat hCG, quantitative: <5 m[IU]/mL (ref <5)

## 2021-08-17 LAB — LIPASE, BLOOD: Lipase: 64 U/L — ABNORMAL HIGH (ref 11–51)

## 2021-08-17 MED ORDER — PREDNISONE 20 MG PO TABS
40.0000 mg | ORAL_TABLET | Freq: Once | ORAL | Status: AC
Start: 2021-08-17 — End: 2021-08-17
  Administered 2021-08-17: 40 mg via ORAL
  Filled 2021-08-17: qty 2

## 2021-08-17 MED ORDER — PREDNISONE 20 MG PO TABS: 40.0000 mg | ORAL_TABLET | Freq: Every day | ORAL | 0 refills | Status: DC

## 2021-08-17 MED ORDER — BENZONATATE 100 MG PO CAPS: 100.0000 mg | ORAL_CAPSULE | Freq: Three times a day (TID) | ORAL | 0 refills | Status: DC

## 2021-08-17 MED ORDER — BENZONATATE 100 MG PO CAPS
200.0000 mg | ORAL_CAPSULE | Freq: Once | ORAL | Status: AC
  Administered 2021-08-17: 200 mg via ORAL
  Filled 2021-08-17: qty 2

## 2021-08-17 MED ORDER — ALBUTEROL SULFATE HFA 108 (90 BASE) MCG/ACT IN AERS
2.0000 | INHALATION_SPRAY | Freq: Once | RESPIRATORY_TRACT | Status: AC
  Administered 2021-08-17: 2 via RESPIRATORY_TRACT
  Filled 2021-08-17: qty 6.7

## 2021-08-17 NOTE — ED Provider Triage Note (Signed)
Emergency Medicine Provider Triage Evaluation Note ? ?Pamela Sanford , a 41 y.o. female  was evaluated in triage.  Pt complains of cough for 1 week.  She states that then yesterday she was lying on her stomach on the bed and coughed and then had pain in her upper abdomen mostly on the right side and right-sided flank.  She is status post tubal ligation per her report however denies any other abdominal surgeries.  She denies any urinary symptoms.  No abnormal discharge.  She denies any fevers or headache.  She does report with her cough she had sneezing and mild nasal congestion. ?She denies any history of kidney stones.  No hematuria. ? ? ?Physical Exam  ?BP (!) 117/105 (BP Location: Right Arm)   Pulse 82   Temp 98.7 ?F (37.1 ?C) (Oral)   Resp 20   Ht 5\' 2"  (1.575 m)   Wt 68 kg   SpO2 100%   BMI 27.44 kg/m?  ?Gen:   Awake, no distress   ?Resp:  Normal effort  ?MSK:   Moves extremities without difficulty  ?Other:  Abdomen is tender to palpation primarily in the right upper quadrant.  There is right CVA tenderness to percussion. ? ?Medical Decision Making  ?Medically screening exam initiated at 1:14 PM.  Appropriate orders placed.  Pamela Sanford was informed that the remainder of the evaluation will be completed by another provider, this initial triage assessment does not replace that evaluation, and the importance of remaining in the ED until their evaluation is complete. ? ?Note: Portions of this report may have been transcribed using voice recognition software. Every effort was made to ensure accuracy; however, inadvertent computerized transcription errors may be present ? ?  ? , Cristina Gong ?08/17/21 1315 ? ?

## 2021-08-17 NOTE — Discharge Instructions (Signed)
Prednisone is a steroid that helps to reduce certain types of inflammation and may be used for allergic reactions, some rashes such as poison ivy or dermatitis, for asthma attacks or bronchitis and for certain types of pain.  Please take this medicine exactly as prescribed - 40mg  by mouth daily for 5 days.  This can have certain side effects with some people including feeling like you can't sleep, feeling anxious or feeling like you are on a "high".  It should not cause weight gain if only taken for a short time.   ? ?Please take 2 puffs of albuterol every 4 hours for the first 24 hours, then every 4 hours as needed.  This medicine will help to open up your lungs and allow you breathe easier, cough less and have less tightness in your chest.  This medicine can be taken with you - it is small and portable.   ? ?Please take Tessalon 100mg  by mouth 3 times daily for cough as needed. ? ? ?Thank you for letting take care of you today! ? ?Please obtain all of your results from medical records or have your doctors office obtain the results - share them with your doctor - you should be seen at your doctors office in the next 2 days. Call today to arrange your follow up. Take the medications as prescribed. Please review all of the medicines and only take them if you do not have an allergy to them. Please be aware that if you are taking birth control pills, taking other prescriptions, ESPECIALLY ANTIBIOTICS may make the birth control ineffective - if this is the case, either do not engage in sexual activity or use alternative methods of birth control such as condoms until you have finished the medicine and your family doctor says it is OK to restart them. If you are on a blood thinner such as COUMADIN, be aware that any other medicine that you take may cause the coumadin to either work too much, or not enough - you should have your coumadin level rechecked in next 7 days if this is the case.  ??  ?It is also a possibility  that you have an allergic reaction to any of the medicines that you have been prescribed - Everybody reacts differently to medications and while MOST people have no trouble with most medicines, you may have a reaction such as nausea, vomiting, rash, swelling, shortness of breath. If this is the case, please stop taking the medicine immediately and contact your physician.  ? ?If you were given a medication in the ED such as percocet, vicodin, or morphine, be aware that these medicines are sedating and may change your ability to take care of yourself adequately for several hours after being given this medicines - you should not drive or take care of small children if you were given this medicine in the Emergency Department or if you have been prescribed these types of medicines. ??  ? You should return to the ER IMMEDIATELY if you develop severe or worsening symptoms.  ? ?

## 2021-08-17 NOTE — ED Triage Notes (Signed)
Pt arrived POV from home c/o a cough and RLQ abdominal pain and right flank pain x1 week. Pt states it is just getting worse.   ?

## 2021-08-17 NOTE — ED Provider Notes (Signed)
?[facility] ?Provider Note ? ? ?CSN: [id number] ?Arrival date & time: 08/17/21  1227 ? ?  ? ?History ? ?Chief Complaint  ?Patient presents with  ? Cough  ? Flank Pain  ? ? ?Pamela Sanford is a 41 y.o. female. ? ? ?Cough ?Flank Pain ? ?This patient is a [age] atrial female history of asthma and tobacco use, presenting with about 1 week of a cough which is occasionally productive but mostly dry and not associated with fevers or swelling of the legs.  She reports that yesterday she coughed so hard while she was lying down that she felt the pain in her abdomen, this pain is worse with coughing worse with moving but goes away when she lays still.  No vomiting no diarrhea no fevers.  Not taking any specific medications, she is out of her inhaler for at least a month ? ?Home Medications ?Prior to Admission medications   ?Medication Sig Start Date End Date Taking? Authorizing Provider  ?benzonatate (TESSALON) 100 MG capsule Take 1 capsule (100 mg total) by mouth every 8 (eight) hours. 08/17/21  Yes Eber HongMiller, Kynsleigh Westendorf, MD  ?predniSONE (DELTASONE) 20 MG tablet Take 2 tablets (40 mg total) by mouth daily. 08/17/21  Yes Eber HongMiller, Faisal Stradling, MD  ?acetaminophen (TYLENOL) 650 MG CR tablet Take 650 mg by mouth every 8 (eight) hours as needed for pain.    [provider]  ?albuterol (PROVENTIL HFA;VENTOLIN HFA) 108 (90 Base) MCG/ACT inhaler Inhale 2 puffs into the lungs every 6 (six) hours as needed for wheezing or shortness of breath. 07/23/18   Clapacs, Jackquline DenmarkJohn T, MD  ?amoxicillin (AMOXIL) 500 MG capsule Take 1 capsule (500 mg total) by mouth 3 (three) times daily. 10/28/19   Jacquelin HawkingMcElroy, Shannon, PA-C  ?fluticasone (FLONASE) 50 MCG/ACT nasal spray Place 1 spray into both nostrils daily. 10/10/19   Petrucelli, Samantha R, PA-C  ?lidocaine (LIDODERM) 5 % Place 1 patch onto the skin daily. Place 1 patch directly over the right side of your neck where area was tender on exam in the emergency department.   Remove & Discard patch within 12 hours. 10/10/19   Petrucelli, Samantha R, PA-C  ?sertraline (ZOLOFT) 50 MG tablet Take 1 tablet (50 mg total) by mouth daily. ?Patient not taking: Reported on 01/17/2019 09/04/18 08/11/19  Oneta RackLewis, Tanika N, NP  ?traZODone (DESYREL) 50 MG tablet Take 1 tablet (50 mg total) by mouth at bedtime as needed for sleep. ?Patient not taking: Reported on 01/17/2019 09/04/18 08/11/19  Oneta RackLewis, Tanika N, NP  ?   ? ?Allergies    ?Ibuprofen and Tramadol   ? ?Review of Systems   ?Review of Systems  ?Respiratory:  Positive for cough.   ?Genitourinary:  Positive for flank pain.  ?All other systems reviewed and are negative. ? ?Physical Exam ?Updated Vital Signs ?BP (!) 117/105 (BP Location: Right Arm)   Pulse 82   Temp 98.7 ?F (37.1 ?C) (Oral)   Resp 20   Ht 1.575 m (5\' 2" )   Wt 68 kg   SpO2 100%   BMI 27.44 kg/m?  ?Physical Exam ?Vitals and nursing note reviewed.  ?Constitutional:   ?   General: She is not in acute distress. ?   Appearance: She is well-developed.  ?HENT:  ?   Head: Normocephalic and atraumatic.  ?   Mouth/Throat:  ?   Pharynx: No oropharyngeal exudate.  ?Eyes:  ?   General: No scleral icterus.    ?   Right eye: No  discharge.     ?   Left eye: No discharge.  ?   Conjunctiva/sclera: Conjunctivae normal.  ?   Pupils: Pupils are equal, round, and reactive to light.  ?Neck:  ?   Thyroid: No thyromegaly.  ?   Vascular: No JVD.  ?Cardiovascular:  ?   Rate and Rhythm: Normal rate and regular rhythm.  ?   Heart sounds: Normal heart sounds. No murmur heard. ?  No friction rub. No gallop.  ?Pulmonary:  ?   Effort: Pulmonary effort is normal. No respiratory distress.  ?   Breath sounds: Normal breath sounds. No wheezing or rales.  ?Chest:  ?   Chest wall: No tenderness.  ?Abdominal:  ?   General: Bowel sounds are normal. There is no distension.  ?   Palpations: Abdomen is soft. There is no mass.  ?   Tenderness: There is abdominal tenderness.  ?Musculoskeletal:     ?   General: No tenderness. Normal  range of motion.  ?   Cervical back: Normal range of motion and neck supple.  ?   Right lower leg: No edema.  ?   Left lower leg: No edema.  ?Lymphadenopathy:  ?   Cervical: No cervical adenopathy.  ?Skin: ?   General: Skin is warm and dry.  ?   Findings: No erythema or rash.  ?Neurological:  ?   Mental Status: She is alert.  ?   Coordination: Coordination normal.  ?Psychiatric:     ?   Behavior: Behavior normal.  ? ? ?ED Results / Procedures / Treatments   ?Labs ?(all labs ordered are listed, but only abnormal results are displayed) ?Labs Reviewed  ?CBC WITH DIFFERENTIAL/PLATELET - Abnormal; Notable for the following components:  ?    Result Value  ? WBC 10.8 (*)   ? Neutro Abs 8.1 (*)   ? All other components within normal limits  ?COMPREHENSIVE METABOLIC PANEL - Abnormal; Notable for the following components:  ? BUN 5 (*)   ? All other components within normal limits  ?LIPASE, BLOOD - Abnormal; Notable for the following components:  ? Lipase 64 (*)   ? All other components within normal limits  ?URINALYSIS, ROUTINE W REFLEX MICROSCOPIC - Abnormal; Notable for the following components:  ? Color, Urine STRAW (*)   ? Leukocytes,Ua TRACE (*)   ? Bacteria, UA RARE (*)   ? All other components within normal limits  ?RESP PANEL BY RT-PCR (FLU A&B, COVID) ARPGX2  ?I-STAT BETA HCG BLOOD, ED ([facility], [facility], [facility] ONLY)  ? ? ?EKG ?None ? ?Radiology ?DG Chest 2 View ? ?Result Date: 08/17/2021 ?CLINICAL DATA:  One week of cough. EXAM: CHEST - 2 VIEW COMPARISON:  August 11, 2019 FINDINGS: The heart size and mediastinal contours are within normal limits. Mild perihilar bronchial thickening. No focal airspace consolidation. No pleural effusion. No pneumothorax. The visualized skeletal structures are unremarkable. IMPRESSION: Mild perihilar bronchial thickening suggesting bronchitis. No focal airspace consolidation. Electronically Signed   By: Maudry Mayhew M.D.   On: 08/17/2021 13:32   ? ?Procedures ?Procedures  ? ? ?Medications Ordered in  ED ?Medications  ?predniSONE (DELTASONE) tablet 40 mg (has no administration in time range)  ?benzonatate (TESSALON) capsule 200 mg (has no administration in time range)  ?albuterol (VENTOLIN HFA) 108 (90 Base) MCG/ACT inhaler 2 puff (has no administration in time range)  ? ? ?ED Course/ Medical Decision Making/ A&P ?  ?                        ?  Medical Decision Making ?Risk ?Prescription drug management. ? ? ?Lungs are clear, patient is well-appearing, no signs of pneumonia on x-ray, labs are reassuring, vital signs are unremarkable, she will be given an albuterol inhaler prednisone and Tessalon prescriptions for the same, the patient is agreeable.  Stable for discharge to follow-up in the outpatient setting, counseled on tobacco use ? ?Vision: I personally viewed and interpreted the x-ray which shows no signs of pneumonia ? ?Labs: I viewed and interpreted the lab work which is essentially unremarkable including blood counts and metabolic panel. ? ?Medication management: Patient given albuterol Tessalon and prednisone, albuterol inhaler was discharged home with the patient. ? ?ED course: Patient improved, stable for discharge ? ? ? ? ? ? ? ?Final Clinical Impression(s) / ED Diagnoses ?Final diagnoses:  ?Bronchitis  ? ? ?Rx / DC Orders ?ED Discharge Orders   ? ?      Ordered  ?  predniSONE (DELTASONE) 20 MG tablet  Daily       ? 08/17/21 1616  ?  benzonatate (TESSALON) 100 MG capsule  Every 8 hours       ? 08/17/21 1616  ? ?  ?  ? ?  ? ? ?  ?Eber Hong, MD ?08/17/21 1617 ? ?

## 2021-08-17 NOTE — ED Provider Notes (Incomplete)
?[facility] EMERGENCY DEPARTMENT ?Provider Note ? ? ?CSN: [id number] ?Arrival date & time: 08/17/21  1227 ? ?  ? ?History ? ?Chief Complaint  ?Patient presents with  ? Cough  ? Flank Pain  ? ? ?Pamela Sanford is a 41 y.o. female with past medical history of asthma, GERD, hypertension ? ? ?Cough ?Flank Pain ? ? ?  ? ?Home Medications ?Prior to Admission medications   ?Medication Sig Start Date End Date Taking? Authorizing Provider  ?acetaminophen (TYLENOL) 650 MG CR tablet Take 650 mg by mouth every 8 (eight) hours as needed for pain.    [provider]  ?albuterol (PROVENTIL HFA;VENTOLIN HFA) 108 (90 Base) MCG/ACT inhaler Inhale 2 puffs into the lungs every 6 (six) hours as needed for wheezing or shortness of breath. 07/23/18   Clapacs, Jackquline Denmark, MD  ?amoxicillin (AMOXIL) 500 MG capsule Take 1 capsule (500 mg total) by mouth 3 (three) times daily. 10/28/19   Jacquelin Hawking, PA-C  ?fluticasone (FLONASE) 50 MCG/ACT nasal spray Place 1 spray into both nostrils daily. 10/10/19   Petrucelli, Samantha R, PA-C  ?lidocaine (LIDODERM) 5 % Place 1 patch onto the skin daily. Place 1 patch directly over the right side of your neck where area was tender on exam in the emergency department.  Remove & Discard patch within 12 hours. 10/10/19   Petrucelli, Samantha R, PA-C  ?sertraline (ZOLOFT) 50 MG tablet Take 1 tablet (50 mg total) by mouth daily. ?Patient not taking: Reported on 01/17/2019 09/04/18 08/11/19  Oneta Rack, NP  ?traZODone (DESYREL) 50 MG tablet Take 1 tablet (50 mg total) by mouth at bedtime as needed for sleep. ?Patient not taking: Reported on 01/17/2019 09/04/18 08/11/19  Oneta Rack, NP  ?   ? ?Allergies    ?Ibuprofen and Tramadol   ? ?Review of Systems   ?Review of Systems  ?Respiratory:  Positive for cough.   ?Genitourinary:  Positive for flank pain.  ? ?Physical Exam ?Updated Vital Signs ?BP (!) 117/105 (BP Location: Right Arm)   Pulse 82   Temp 98.7 ?F (37.1 ?C) (Oral)   Resp 20    Ht 5\' 2"  (1.575 m)   Wt 68 kg   SpO2 100%   BMI 27.44 kg/m?  ?Physical Exam ? ?ED Results / Procedures / Treatments   ?Labs ?(all labs ordered are listed, but only abnormal results are displayed) ?Labs Reviewed  ?CBC WITH DIFFERENTIAL/PLATELET - Abnormal; Notable for the following components:  ?    Result Value  ? WBC 10.8 (*)   ? Neutro Abs 8.1 (*)   ? All other components within normal limits  ?COMPREHENSIVE METABOLIC PANEL - Abnormal; Notable for the following components:  ? BUN 5 (*)   ? All other components within normal limits  ?LIPASE, BLOOD - Abnormal; Notable for the following components:  ? Lipase 64 (*)   ? All other components within normal limits  ?URINALYSIS, ROUTINE W REFLEX MICROSCOPIC - Abnormal; Notable for the following components:  ? Color, Urine STRAW (*)   ? Leukocytes,Ua TRACE (*)   ? Bacteria, UA RARE (*)   ? All other components within normal limits  ?RESP PANEL BY RT-PCR (FLU A&B, COVID) ARPGX2  ?I-STAT BETA HCG BLOOD, ED ([facility], [facility], [facility] ONLY)  ? ? ?EKG ?None ? ?Radiology ?DG Chest 2 View ? ?Result Date: 08/17/2021 ?CLINICAL DATA:  One week of cough. EXAM: CHEST - 2 VIEW COMPARISON:  August 11, 2019 FINDINGS: The heart size and mediastinal  contours are within normal limits. Mild perihilar bronchial thickening. No focal airspace consolidation. No pleural effusion. No pneumothorax. The visualized skeletal structures are unremarkable. IMPRESSION: Mild perihilar bronchial thickening suggesting bronchitis. No focal airspace consolidation. Electronically Signed   By: Maudry Mayhew M.D.   On: 08/17/2021 13:32   ? ?Procedures ?Procedures  ? ?Medications Ordered in ED ?Medications - No data to display ? ?ED Course/ Medical Decision Making/ A&P ?  ?                        ?Medical Decision Making ? ?*** ? ?Labs remarkable for a lipase of 64.  CMP is overall reassuring with normal bilirubin and liver enzymes.  Mild leukocytosis of 10.8.  UA straw-colored with trace leukocytes and rare amount of  bacteria.  Nitrite negative and no WBCs noted.  Overall UA not consistent with infection.  Pregnancy test is negative.  Chest x-ray my review shows no focal findings with possible perihilar thickening which is confirmed by radiology.   ? ?Final Clinical Impression(s) / ED Diagnoses ?Final diagnoses:  ?None  ? ? ?Rx / DC Orders ?ED Discharge Orders   ? ? None  ? ?  ? ? ?

## 2021-11-10 DIAGNOSIS — K029 Dental caries, unspecified: Secondary | ICD-10-CM | POA: Insufficient documentation

## 2021-11-10 DIAGNOSIS — K0889 Other specified disorders of teeth and supporting structures: Secondary | ICD-10-CM | POA: Diagnosis not present

## 2021-11-11 ENCOUNTER — Encounter (HOSPITAL_COMMUNITY): Payer: Self-pay | Admitting: Emergency Medicine

## 2021-11-11 ENCOUNTER — Emergency Department (HOSPITAL_COMMUNITY)
Admission: EM | Admit: 2021-11-11 | Discharge: 2021-11-11 | Disposition: A | Discharge status: Home / Self Care | Payer: 59 | Attending: Emergency Medicine | Admitting: Emergency Medicine

## 2021-11-11 DIAGNOSIS — K0889 Other specified disorders of teeth and supporting structures: Secondary | ICD-10-CM

## 2021-11-11 DIAGNOSIS — K029 Dental caries, unspecified: Secondary | ICD-10-CM

## 2021-11-11 MED ORDER — PENICILLIN V POTASSIUM 500 MG PO TABS: 500.0000 mg | ORAL_TABLET | Freq: Four times a day (QID) | ORAL | 0 refills | Status: DC

## 2021-11-11 MED ORDER — PENICILLIN V POTASSIUM 500 MG PO TABS: 500.0000 mg | ORAL_TABLET | Freq: Four times a day (QID) | ORAL | 0 refills | Status: AC

## 2021-11-11 NOTE — ED Triage Notes (Signed)
Pt c/o right bottom dental pain since this morning. Pt states she is unable to afford a dentist.

## 2021-11-11 NOTE — ED Notes (Signed)
ED Provider at bedside. 

## 2021-11-11 NOTE — ED Provider Notes (Signed)
[facility] EMERGENCY DEPARTMENT Provider Note   CSN: [id number] Arrival date & time: 11/10/21  2343     History  Chief Complaint  Patient presents with   Dental Pain    Pamela Sanford is a 41 y.o. female.  Presents to the emergency department for evaluation of dental pain.  Patient reports that she is having pain on the bottom portion of her mouth since this morning.       Home Medications Prior to Admission medications   Medication Sig Start Date End Date Taking? Authorizing Provider  penicillin v potassium (VEETID) 500 MG tablet Take 1 tablet (500 mg total) by mouth 4 (four) times daily for 10 days. 11/11/21 11/21/21 Yes Meggan Dhaliwal, Canary Brim, MD  acetaminophen (TYLENOL) 650 MG CR tablet Take 650 mg by mouth every 8 (eight) hours as needed for pain.    [provider]  albuterol (PROVENTIL HFA;VENTOLIN HFA) 108 (90 Base) MCG/ACT inhaler Inhale 2 puffs into the lungs every 6 (six) hours as needed for wheezing or shortness of breath. 07/23/18   Clapacs, Jackquline Denmark, MD  amoxicillin (AMOXIL) 500 MG capsule Take 1 capsule (500 mg total) by mouth 3 (three) times daily. 10/28/19   Jacquelin Hawking, PA-C  benzonatate (TESSALON) 100 MG capsule Take 1 capsule (100 mg total) by mouth every 8 (eight) hours. 08/17/21   Eber Hong, MD  fluticasone (FLONASE) 50 MCG/ACT nasal spray Place 1 spray into both nostrils daily. 10/10/19   Petrucelli, Samantha R, PA-C  lidocaine (LIDODERM) 5 % Place 1 patch onto the skin daily. Place 1 patch directly over the right side of your neck where area was tender on exam in the emergency department.  Remove & Discard patch within 12 hours. 10/10/19   Petrucelli, Samantha R, PA-C  predniSONE (DELTASONE) 20 MG tablet Take 2 tablets (40 mg total) by mouth daily. 08/17/21   Eber Hong, MD  sertraline (ZOLOFT) 50 MG tablet Take 1 tablet (50 mg total) by mouth daily. Patient not taking: Reported on 01/17/2019 09/04/18 08/11/19  Oneta Rack, NP  traZODone  (DESYREL) 50 MG tablet Take 1 tablet (50 mg total) by mouth at bedtime as needed for sleep. Patient not taking: Reported on 01/17/2019 09/04/18 08/11/19  Oneta Rack, NP      Allergies    Ibuprofen and Tramadol    Review of Systems   Review of Systems  Physical Exam Updated Vital Signs BP 112/69 (BP Location: Right Arm)   Pulse 98   Temp 98.1 F (36.7 C) (Oral)   Resp 18   Ht 5\' 2"  (1.575 m)   Wt 66.7 kg   SpO2 100%   BMI 26.89 kg/m  Physical Exam Constitutional:      Appearance: Normal appearance.  HENT:     Head:     Comments: Every one of her teeth is severely decayed.  No evidence of dental abscess Eyes:     Pupils: Pupils are equal, round, and reactive to light.  Cardiovascular:     Rate and Rhythm: Normal rate and regular rhythm.  Pulmonary:     Effort: Pulmonary effort is normal.     Breath sounds: Normal breath sounds.  Musculoskeletal:        General: Normal range of motion.  Skin:    General: Skin is warm and dry.     Findings: No erythema.  Neurological:     Mental Status: She is alert.     ED Results / Procedures / Treatments  Labs (all labs ordered are listed, but only abnormal results are displayed) Labs Reviewed - No data to display  EKG None  Radiology No results found.  Procedures Procedures    Medications Ordered in ED Medications - No data to display  ED Course/ Medical Decision Making/ A&P                           Medical Decision Making  Patient presents with dental pain.  She says it started this morning but she has widespread severe dental decay of all of her teeth.  No obvious dental abscess.  Will provide antibiotics.        Final Clinical Impression(s) / ED Diagnoses Final diagnoses:  Pain, dental  Dental caries    Rx / DC Orders ED Discharge Orders          Ordered    penicillin v potassium (VEETID) 500 MG tablet  4 times daily        11/11/21 0043              Gilda Crease,  MD 11/11/21 [id number]

## 2022-07-16 ENCOUNTER — Encounter (HOSPITAL_COMMUNITY): Payer: Self-pay | Admitting: *Deleted

## 2022-07-16 ENCOUNTER — Emergency Department (HOSPITAL_COMMUNITY): Payer: 59

## 2022-07-16 ENCOUNTER — Emergency Department (HOSPITAL_COMMUNITY)
Admission: EM | Admit: 2022-07-16 | Discharge: 2022-07-16 | Disposition: A | Discharge status: Home / Self Care | Payer: 59 | Attending: Emergency Medicine | Admitting: Emergency Medicine

## 2022-07-16 ENCOUNTER — Other Ambulatory Visit: Payer: Self-pay

## 2022-07-16 DIAGNOSIS — R0789 Other chest pain: Secondary | ICD-10-CM | POA: Diagnosis not present

## 2022-07-16 DIAGNOSIS — I10 Essential (primary) hypertension: Secondary | ICD-10-CM | POA: Diagnosis not present

## 2022-07-16 DIAGNOSIS — R69 Illness, unspecified: Secondary | ICD-10-CM | POA: Diagnosis not present

## 2022-07-16 DIAGNOSIS — R079 Chest pain, unspecified: Secondary | ICD-10-CM | POA: Diagnosis not present

## 2022-07-16 DIAGNOSIS — F17210 Nicotine dependence, cigarettes, uncomplicated: Secondary | ICD-10-CM | POA: Insufficient documentation

## 2022-07-16 LAB — BASIC METABOLIC PANEL
Anion gap: 10 (ref 5–15)
BUN: 7 mg/dL (ref 6–20)
CO2: 25 mmol/L (ref 22–32)
Calcium: 8.6 mg/dL — ABNORMAL LOW (ref 8.9–10.3)
Chloride: 104 mmol/L (ref 98–111)
Creatinine, Ser: 0.79 mg/dL (ref 0.44–1.00)
GFR, Estimated: >60 mL/min (ref >60)
Glucose, Bld: 79 mg/dL (ref 70–99)
Potassium: 3.5 mmol/L (ref 3.5–5.1)
Sodium: 139 mmol/L (ref 135–145)

## 2022-07-16 LAB — CBC
HCT: 39.5 % (ref 36.0–46.0)
Hemoglobin: 13.2 g/dL (ref 12.0–15.0)
MCH: 29.7 pg (ref 26.0–34.0)
MCHC: 33.4 g/dL (ref 30.0–36.0)
MCV: 89 fL (ref 80.0–100.0)
Platelets: 233 10*3/uL (ref 150–400)
RBC: 4.44 MIL/uL (ref 3.87–5.11)
RDW: 13.2 % (ref 11.5–15.5)
WBC: 10.1 10*3/uL (ref 4.0–10.5)
nRBC: 0 % (ref 0.0–0.2)

## 2022-07-16 LAB — TROPONIN I (HIGH SENSITIVITY): Troponin I (High Sensitivity): <2 ng/L (ref <18)

## 2022-07-16 LAB — CBG MONITORING, ED: Glucose-Capillary: 84 mg/dL (ref 70–99)

## 2022-07-16 LAB — POC URINE PREG, ED: Preg Test, Ur: NEGATIVE
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
## 2022-07-16 NOTE — ED Notes (Signed)
Went over [employer]. All questions answered. Ambulatory to lobby with family.

## 2022-07-16 NOTE — ED Notes (Signed)
Pt to XR

## 2022-07-16 NOTE — Discharge Instructions (Signed)
Thankfully your testing has been normal, no signs of heart attack, your blood pressure has normalized.  I do want you to follow-up with a family doctor within 2 weeks for recheck, if you do not have a doctor see below.  ER for worsening symptoms  [facility] Doctor List    Tula Nakayama, MD. Specialty: [facility] Medicine Contact information: [address]  [place] [address]  [phone]   Sallee Lange, MD. Specialty: [facility] Medicine Contact information: [place]  [place] [address]  [phone]   Rosita Fire, MD Specialty: Internal Medicine Contact information: [place] [address]  [phone]   Delphina Cahill, MD. Specialty: Internal Medicine Contact information: [place] [address]  [phone]    [facility] (Dr. Maudie Mercury) Specialty: Family Medicine Contact information: [place] [address]  [phone]   Leslie Andrea, MD. Specialty: [facility] Medicine Contact information: [place] [address]  [phone]   Asencion Noble, MD. Specialty: Internal Medicine Contact information: [place] [address]  [place] [address]  [place]  [address] [place], [place] [address] [phone]  Services [facility] offers a variety of basic health services.  Services include but are not limited to: Blood pressure checks  Heart rate checks  Blood sugar checks  Urine analysis  Rapid strep tests  Pregnancy tests.  Health education and referrals  People needing more complex services will be directed to a physician online. Using these virtual visits, doctors can evaluate and prescribe medicine and treatments. There will be no medication on-site, though [facility] will help patients fill their prescriptions at little to no  cost.   For More information please go to: [email or web address]

## 2022-07-31 DIAGNOSIS — F1420 Cocaine dependence, uncomplicated: Secondary | ICD-10-CM | POA: Diagnosis not present

## 2022-08-07 DIAGNOSIS — F1420 Cocaine dependence, uncomplicated: Secondary | ICD-10-CM | POA: Diagnosis not present

## 2022-08-09 DIAGNOSIS — F1420 Cocaine dependence, uncomplicated: Secondary | ICD-10-CM | POA: Diagnosis not present

## 2022-08-12 DIAGNOSIS — F329 Major depressive disorder, single episode, unspecified: Secondary | ICD-10-CM | POA: Diagnosis not present

## 2022-08-14 DIAGNOSIS — F1420 Cocaine dependence, uncomplicated: Secondary | ICD-10-CM | POA: Diagnosis not present

## 2022-08-16 DIAGNOSIS — F1120 Opioid dependence, uncomplicated: Secondary | ICD-10-CM | POA: Diagnosis not present

## 2022-08-21 DIAGNOSIS — F1120 Opioid dependence, uncomplicated: Secondary | ICD-10-CM | POA: Diagnosis not present

## 2022-08-23 DIAGNOSIS — F1420 Cocaine dependence, uncomplicated: Secondary | ICD-10-CM | POA: Diagnosis not present

## 2022-08-28 DIAGNOSIS — F1420 Cocaine dependence, uncomplicated: Secondary | ICD-10-CM | POA: Diagnosis not present

## 2022-09-16 DIAGNOSIS — F1120 Opioid dependence, uncomplicated: Secondary | ICD-10-CM | POA: Diagnosis not present

## 2022-09-18 DIAGNOSIS — F1420 Cocaine dependence, uncomplicated: Secondary | ICD-10-CM | POA: Diagnosis not present

## 2022-09-20 DIAGNOSIS — F1420 Cocaine dependence, uncomplicated: Secondary | ICD-10-CM | POA: Diagnosis not present

## 2022-09-21 DIAGNOSIS — K760 Fatty (change of) liver, not elsewhere classified: Secondary | ICD-10-CM | POA: Diagnosis not present

## 2022-09-21 DIAGNOSIS — T2101XA Burn of unspecified degree of chest wall, initial encounter: Secondary | ICD-10-CM | POA: Diagnosis not present

## 2022-09-21 DIAGNOSIS — T23201A Burn of second degree of right hand, unspecified site, initial encounter: Secondary | ICD-10-CM | POA: Diagnosis not present

## 2022-09-21 DIAGNOSIS — T23021A Burn of unspecified degree of single right finger (nail) except thumb, initial encounter: Secondary | ICD-10-CM | POA: Diagnosis not present

## 2022-09-21 DIAGNOSIS — M7989 Other specified soft tissue disorders: Secondary | ICD-10-CM | POA: Diagnosis not present

## 2022-09-21 DIAGNOSIS — S301XXA Contusion of abdominal wall, initial encounter: Secondary | ICD-10-CM | POA: Diagnosis not present

## 2022-09-22 ENCOUNTER — Encounter (HOSPITAL_COMMUNITY): Payer: Self-pay

## 2022-09-22 ENCOUNTER — Other Ambulatory Visit: Payer: Self-pay

## 2022-09-22 ENCOUNTER — Emergency Department (HOSPITAL_COMMUNITY)
Admission: EM | Admit: 2022-09-22 | Discharge: 2022-09-22 | Disposition: A | Discharge status: Home / Self Care | Payer: 59 | Attending: Emergency Medicine | Admitting: Emergency Medicine

## 2022-09-22 DIAGNOSIS — T23201A Burn of second degree of right hand, unspecified site, initial encounter: Secondary | ICD-10-CM | POA: Diagnosis not present

## 2022-09-22 DIAGNOSIS — F17200 Nicotine dependence, unspecified, uncomplicated: Secondary | ICD-10-CM | POA: Diagnosis not present

## 2022-09-22 DIAGNOSIS — Y93G2 Activity, grilling and smoking food: Secondary | ICD-10-CM | POA: Diagnosis not present

## 2022-09-22 DIAGNOSIS — K760 Fatty (change of) liver, not elsewhere classified: Secondary | ICD-10-CM | POA: Diagnosis not present

## 2022-09-22 DIAGNOSIS — T23261A Burn of second degree of back of right hand, initial encounter: Secondary | ICD-10-CM | POA: Diagnosis not present

## 2022-09-22 DIAGNOSIS — S301XXA Contusion of abdominal wall, initial encounter: Secondary | ICD-10-CM | POA: Diagnosis not present

## 2022-09-22 DIAGNOSIS — T2101XA Burn of unspecified degree of chest wall, initial encounter: Secondary | ICD-10-CM | POA: Diagnosis not present

## 2022-09-22 DIAGNOSIS — T23021A Burn of unspecified degree of single right finger (nail) except thumb, initial encounter: Secondary | ICD-10-CM | POA: Diagnosis not present

## 2022-09-22 DIAGNOSIS — T23161A Burn of first degree of back of right hand, initial encounter: Secondary | ICD-10-CM | POA: Diagnosis not present

## 2022-09-22 DIAGNOSIS — T23061A Burn of unspecified degree of back of right hand, initial encounter: Secondary | ICD-10-CM | POA: Diagnosis not present

## 2022-09-22 DIAGNOSIS — X150XXA Contact with hot stove (kitchen), initial encounter: Secondary | ICD-10-CM | POA: Diagnosis not present

## 2022-09-22 DIAGNOSIS — T310 Burns involving less than 10% of body surface: Secondary | ICD-10-CM | POA: Diagnosis not present

## 2022-09-22 DIAGNOSIS — M7989 Other specified soft tissue disorders: Secondary | ICD-10-CM | POA: Diagnosis not present

## 2022-09-22 MED ORDER — SILVER SULFADIAZINE 1 % EX CREA: 1.0000 | TOPICAL_CREAM | Freq: Every day | CUTANEOUS | 0 refills | Status: DC

## 2022-09-22 MED ORDER — LORAZEPAM 1 MG PO TABS
1.0000 mg | ORAL_TABLET | Freq: Once | ORAL | Status: AC
  Administered 2022-09-22: 1 mg via ORAL
  Filled 2022-09-22: qty 1

## 2022-09-22 MED ORDER — OXYCODONE-ACETAMINOPHEN 5-325 MG PO TABS
1.0000 | ORAL_TABLET | Freq: Once | ORAL | Status: AC
  Administered 2022-09-22: 1 via ORAL
  Filled 2022-09-22: qty 1

## 2022-09-22 MED ORDER — SILVER SULFADIAZINE 1 % EX CREA
TOPICAL_CREAM | Freq: Once | CUTANEOUS | Status: AC
  Filled 2022-09-22: qty 50

## 2022-09-22 MED ORDER — OXYCODONE-ACETAMINOPHEN 5-325 MG PO TABS: 1.0000 | ORAL_TABLET | Freq: Four times a day (QID) | ORAL | 0 refills | Status: DC | PRN
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
## 2022-09-25 DIAGNOSIS — F1420 Cocaine dependence, uncomplicated: Secondary | ICD-10-CM | POA: Diagnosis not present

## 2022-09-27 DIAGNOSIS — F1420 Cocaine dependence, uncomplicated: Secondary | ICD-10-CM | POA: Diagnosis not present

## 2022-10-02 DIAGNOSIS — F1420 Cocaine dependence, uncomplicated: Secondary | ICD-10-CM | POA: Diagnosis not present

## 2022-10-04 DIAGNOSIS — F1420 Cocaine dependence, uncomplicated: Secondary | ICD-10-CM | POA: Diagnosis not present

## 2022-10-09 DIAGNOSIS — F1020 Alcohol dependence, uncomplicated: Secondary | ICD-10-CM | POA: Diagnosis not present

## 2022-10-09 DIAGNOSIS — F1420 Cocaine dependence, uncomplicated: Secondary | ICD-10-CM | POA: Diagnosis not present

## 2022-10-09 DIAGNOSIS — F1120 Opioid dependence, uncomplicated: Secondary | ICD-10-CM | POA: Diagnosis not present

## 2022-10-11 DIAGNOSIS — F1420 Cocaine dependence, uncomplicated: Secondary | ICD-10-CM | POA: Diagnosis not present

## 2022-10-15 DIAGNOSIS — F329 Major depressive disorder, single episode, unspecified: Secondary | ICD-10-CM | POA: Diagnosis not present

## 2022-10-16 DIAGNOSIS — F1120 Opioid dependence, uncomplicated: Secondary | ICD-10-CM | POA: Diagnosis not present

## 2022-10-23 DIAGNOSIS — F1420 Cocaine dependence, uncomplicated: Secondary | ICD-10-CM | POA: Diagnosis not present

## 2022-10-25 DIAGNOSIS — F1420 Cocaine dependence, uncomplicated: Secondary | ICD-10-CM | POA: Diagnosis not present

## 2022-10-30 DIAGNOSIS — F1420 Cocaine dependence, uncomplicated: Secondary | ICD-10-CM | POA: Diagnosis not present

## 2022-11-01 DIAGNOSIS — F1420 Cocaine dependence, uncomplicated: Secondary | ICD-10-CM | POA: Diagnosis not present

## 2022-11-06 DIAGNOSIS — F1420 Cocaine dependence, uncomplicated: Secondary | ICD-10-CM | POA: Diagnosis not present

## 2022-11-13 DIAGNOSIS — F1420 Cocaine dependence, uncomplicated: Secondary | ICD-10-CM | POA: Diagnosis not present

## 2022-11-15 DIAGNOSIS — F1420 Cocaine dependence, uncomplicated: Secondary | ICD-10-CM | POA: Diagnosis not present

## 2022-11-22 DIAGNOSIS — F1420 Cocaine dependence, uncomplicated: Secondary | ICD-10-CM | POA: Diagnosis not present

## 2022-11-27 DIAGNOSIS — F1420 Cocaine dependence, uncomplicated: Secondary | ICD-10-CM | POA: Diagnosis not present

## 2022-12-13 DIAGNOSIS — F1420 Cocaine dependence, uncomplicated: Secondary | ICD-10-CM | POA: Diagnosis not present

## 2022-12-18 DIAGNOSIS — F1420 Cocaine dependence, uncomplicated: Secondary | ICD-10-CM | POA: Diagnosis not present

## 2022-12-27 DIAGNOSIS — F1420 Cocaine dependence, uncomplicated: Secondary | ICD-10-CM | POA: Diagnosis not present

## 2022-12-30 DIAGNOSIS — K029 Dental caries, unspecified: Secondary | ICD-10-CM | POA: Diagnosis not present

## 2022-12-30 DIAGNOSIS — F32A Depression, unspecified: Secondary | ICD-10-CM | POA: Diagnosis not present

## 2022-12-30 DIAGNOSIS — I10 Essential (primary) hypertension: Secondary | ICD-10-CM | POA: Diagnosis not present

## 2022-12-30 DIAGNOSIS — F17200 Nicotine dependence, unspecified, uncomplicated: Secondary | ICD-10-CM | POA: Diagnosis not present

## 2022-12-30 DIAGNOSIS — J45909 Unspecified asthma, uncomplicated: Secondary | ICD-10-CM | POA: Diagnosis not present

## 2023-01-03 DIAGNOSIS — E663 Overweight: Secondary | ICD-10-CM | POA: Diagnosis not present

## 2023-01-03 DIAGNOSIS — K047 Periapical abscess without sinus: Secondary | ICD-10-CM | POA: Diagnosis not present

## 2023-01-03 DIAGNOSIS — Z6829 Body mass index (BMI) 29.0-29.9, adult: Secondary | ICD-10-CM | POA: Diagnosis not present

## 2023-01-03 DIAGNOSIS — K029 Dental caries, unspecified: Secondary | ICD-10-CM | POA: Diagnosis not present

## 2023-01-27 DIAGNOSIS — R030 Elevated blood-pressure reading, without diagnosis of hypertension: Secondary | ICD-10-CM | POA: Diagnosis not present

## 2023-01-27 DIAGNOSIS — J9801 Acute bronchospasm: Secondary | ICD-10-CM | POA: Diagnosis not present

## 2023-01-27 DIAGNOSIS — B349 Viral infection, unspecified: Secondary | ICD-10-CM | POA: Diagnosis not present

## 2023-01-27 DIAGNOSIS — J029 Acute pharyngitis, unspecified: Secondary | ICD-10-CM | POA: Diagnosis not present

## 2023-01-27 DIAGNOSIS — J101 Influenza due to other identified influenza virus with other respiratory manifestations: Secondary | ICD-10-CM | POA: Diagnosis not present

## 2023-01-27 DIAGNOSIS — J189 Pneumonia, unspecified organism: Secondary | ICD-10-CM | POA: Diagnosis not present

## 2023-01-27 DIAGNOSIS — R051 Acute cough: Secondary | ICD-10-CM | POA: Diagnosis not present

## 2023-01-27 DIAGNOSIS — Z6830 Body mass index (BMI) 30.0-30.9, adult: Secondary | ICD-10-CM | POA: Diagnosis not present

## 2023-01-27 DIAGNOSIS — J209 Acute bronchitis, unspecified: Secondary | ICD-10-CM | POA: Diagnosis not present

## 2023-01-27 DIAGNOSIS — U071 COVID-19: Secondary | ICD-10-CM | POA: Diagnosis not present

## 2023-01-27 DIAGNOSIS — E669 Obesity, unspecified: Secondary | ICD-10-CM | POA: Diagnosis not present

## 2023-01-31 DIAGNOSIS — R030 Elevated blood-pressure reading, without diagnosis of hypertension: Secondary | ICD-10-CM | POA: Diagnosis not present

## 2023-01-31 DIAGNOSIS — Z6830 Body mass index (BMI) 30.0-30.9, adult: Secondary | ICD-10-CM | POA: Diagnosis not present

## 2023-01-31 DIAGNOSIS — L03012 Cellulitis of left finger: Secondary | ICD-10-CM | POA: Diagnosis not present

## 2023-01-31 DIAGNOSIS — E669 Obesity, unspecified: Secondary | ICD-10-CM | POA: Diagnosis not present

## 2023-04-08 DIAGNOSIS — K029 Dental caries, unspecified: Secondary | ICD-10-CM | POA: Diagnosis not present

## 2023-04-08 DIAGNOSIS — I10 Essential (primary) hypertension: Secondary | ICD-10-CM | POA: Diagnosis not present

## 2023-04-08 DIAGNOSIS — K0889 Other specified disorders of teeth and supporting structures: Secondary | ICD-10-CM | POA: Diagnosis not present

## 2023-04-10 DIAGNOSIS — I10 Essential (primary) hypertension: Secondary | ICD-10-CM | POA: Diagnosis not present

## 2023-04-10 DIAGNOSIS — K219 Gastro-esophageal reflux disease without esophagitis: Secondary | ICD-10-CM | POA: Diagnosis not present

## 2023-04-10 DIAGNOSIS — K056 Periodontal disease, unspecified: Secondary | ICD-10-CM | POA: Diagnosis not present

## 2023-04-10 DIAGNOSIS — F17210 Nicotine dependence, cigarettes, uncomplicated: Secondary | ICD-10-CM | POA: Diagnosis not present

## 2023-04-10 DIAGNOSIS — J45909 Unspecified asthma, uncomplicated: Secondary | ICD-10-CM | POA: Diagnosis not present

## 2023-04-10 DIAGNOSIS — F32A Depression, unspecified: Secondary | ICD-10-CM | POA: Diagnosis not present

## 2023-04-10 DIAGNOSIS — K0889 Other specified disorders of teeth and supporting structures: Secondary | ICD-10-CM | POA: Diagnosis not present

## 2023-10-01 ENCOUNTER — Encounter (HOSPITAL_BASED_OUTPATIENT_CLINIC_OR_DEPARTMENT_OTHER): Payer: Self-pay | Admitting: Oral Surgery

## 2023-10-02 NOTE — H&P (Signed)
  Pamela Sanford   PID: [id number]  DOB: [birth year]  SEX: Female   Patient referred by DDS for extraction all remaining teeth.  CC: Bad teeth.  Past Medical History:  Snoring, Smoker, Acid Reflux, Mental Health problems    Medications: Celexa    Allergies:     NKDA    Surgeries:   Oral Surgery, Tubal ligation     Social History       Smoking: yes           Alcohol: Drug use:                             Exam: BMI 29. Roots # 3, 14, only maxillary teeth remaining. Mandibular teeth # 20 - 29 exposed decayed roots.  No purulence, edema, fluctuance, trismus. Oral cancer screening negative. Pharynx clear. No lymphadenopathy.  Panorex:Roots # 3, 14, only maxillary teeth remaining. Mandibular teeth # 20 - 29 exposed decayed roots.   Assessment: ASA 2. Non-restorable teeth # 3, 14, 20, 21, 22, 23, 24, 25, 26, 27, 28, 29.               Plan:   Extraction Teeth # 3, 14, 20, 21, 22, 23, 24, 25, 26, 27, 28, 29. Alveoloplasty.          Hospital Day surgery.                 Rx: n               Risks and complications explained. Questions answered.   Cornelia Dieter, DMD

## 2023-10-07 NOTE — Anesthesia Preprocedure Evaluation (Signed)
 Anesthesia Evaluation  Patient identified by MRN, date of birth, ID band Patient awake    Reviewed: Allergy & Precautions, NPO status , Patient's Chart, lab work & pertinent test results  History of Anesthesia Complications Negative for: history of anesthetic complications  Airway Mallampati: II  TM Distance: >3 FB Neck ROM: Full    Dental  (+) Edentulous Upper, Missing, Poor Dentition,    Pulmonary asthma , Current Smoker   Pulmonary exam normal        Cardiovascular negative cardio ROS Normal cardiovascular exam     Neuro/Psych  Headaches   Depression       GI/Hepatic ,GERD  Controlled,,Remote hx cocaine abuse   Endo/Other  negative endocrine ROS    Renal/GU negative Renal ROS  negative genitourinary   Musculoskeletal negative musculoskeletal ROS (+)    Abdominal   Peds  Hematology negative hematology ROS (+)   Anesthesia Other Findings Day of surgery medications reviewed with patient.  Reproductive/Obstetrics negative OB ROS                              Anesthesia Physical Anesthesia Plan  ASA: 2  Anesthesia Plan: General   Post-op Pain Management: Tylenol  PO (pre-op)*   Induction: Intravenous  PONV Risk Score and Plan: 2 and Ondansetron , Dexamethasone, Treatment may vary due to age or medical condition, Midazolam and Scopolamine patch - Pre-op  Airway Management Planned: Nasal ETT  Additional Equipment: None  Intra-op Plan:   Post-operative Plan: Extubation in OR  Informed Consent: I have reviewed the patients History and Physical, chart, labs and discussed the procedure including the risks, benefits and alternatives for the proposed anesthesia with the patient or authorized representative who has indicated his/her understanding and acceptance.     Dental advisory given  Plan Discussed with: CRNA  Anesthesia Plan Comments:         Anesthesia Quick  Evaluation

## 2023-10-08 ENCOUNTER — Encounter (HOSPITAL_BASED_OUTPATIENT_CLINIC_OR_DEPARTMENT_OTHER)
Admission: RE | Disposition: A | Discharge status: Home / Self Care | Payer: Self-pay | Source: Home / Self Care | Attending: Oral Surgery

## 2023-10-08 ENCOUNTER — Ambulatory Visit (HOSPITAL_BASED_OUTPATIENT_CLINIC_OR_DEPARTMENT_OTHER): Payer: Self-pay | Admitting: Certified Registered Nurse Anesthetist

## 2023-10-08 ENCOUNTER — Other Ambulatory Visit: Payer: Self-pay

## 2023-10-08 ENCOUNTER — Encounter (HOSPITAL_BASED_OUTPATIENT_CLINIC_OR_DEPARTMENT_OTHER): Payer: Self-pay | Admitting: Oral Surgery

## 2023-10-08 ENCOUNTER — Ambulatory Visit (HOSPITAL_BASED_OUTPATIENT_CLINIC_OR_DEPARTMENT_OTHER)
Admission: RE | Admit: 2023-10-08 | Discharge: 2023-10-08 | Disposition: A | Discharge status: Home / Self Care | Attending: Oral Surgery | Admitting: Oral Surgery

## 2023-10-08 DIAGNOSIS — K219 Gastro-esophageal reflux disease without esophagitis: Secondary | ICD-10-CM | POA: Insufficient documentation

## 2023-10-08 DIAGNOSIS — Z87891 Personal history of nicotine dependence: Secondary | ICD-10-CM | POA: Insufficient documentation

## 2023-10-08 DIAGNOSIS — F32A Depression, unspecified: Secondary | ICD-10-CM | POA: Insufficient documentation

## 2023-10-08 DIAGNOSIS — J45909 Unspecified asthma, uncomplicated: Secondary | ICD-10-CM | POA: Diagnosis not present

## 2023-10-08 DIAGNOSIS — Z79899 Other long term (current) drug therapy: Secondary | ICD-10-CM | POA: Diagnosis not present

## 2023-10-08 DIAGNOSIS — K0850 Unsatisfactory restoration of tooth, unspecified: Secondary | ICD-10-CM | POA: Diagnosis not present

## 2023-10-08 DIAGNOSIS — K029 Dental caries, unspecified: Secondary | ICD-10-CM

## 2023-10-08 DIAGNOSIS — Z01818 Encounter for other preprocedural examination: Secondary | ICD-10-CM

## 2023-10-08 DIAGNOSIS — R0683 Snoring: Secondary | ICD-10-CM | POA: Diagnosis not present

## 2023-10-08 DIAGNOSIS — R519 Headache, unspecified: Secondary | ICD-10-CM | POA: Diagnosis not present

## 2023-10-08 DIAGNOSIS — K0889 Other specified disorders of teeth and supporting structures: Secondary | ICD-10-CM | POA: Diagnosis not present

## 2023-10-08 HISTORY — PX: TOOTH EXTRACTION: SHX859

## 2023-10-08 LAB — POCT PREGNANCY, URINE: Preg Test, Ur: NEGATIVE

## 2023-10-08 SURGERY — DENTAL RESTORATION/EXTRACTIONS
Anesthesia: General | Site: Mouth

## 2023-10-08 MED ORDER — FENTANYL CITRATE (PF) 100 MCG/2ML IJ SOLN
INTRAMUSCULAR | Status: AC
  Filled 2023-10-08: qty 2

## 2023-10-08 MED ORDER — CEFAZOLIN SODIUM-DEXTROSE 2-4 GM/100ML-% IV SOLN
2.0000 g | INTRAVENOUS | Status: AC
  Administered 2023-10-08: 2 g via INTRAVENOUS

## 2023-10-08 MED ORDER — CEFAZOLIN SODIUM-DEXTROSE 2-3 GM-%(50ML) IV SOLR
INTRAVENOUS | Status: DC | PRN
Start: 2023-10-08 — End: 2023-10-08
  Administered 2023-10-08: 2 g via INTRAVENOUS

## 2023-10-08 MED ORDER — HYDROCODONE-ACETAMINOPHEN 5-325 MG PO TABS: 1.0000 | ORAL_TABLET | ORAL | 0 refills | Status: AC | PRN

## 2023-10-08 MED ORDER — LACTATED RINGERS IV SOLN: INTRAVENOUS | Status: DC

## 2023-10-08 MED ORDER — DROPERIDOL 2.5 MG/ML IJ SOLN: 0.6250 mg | Freq: Once | INTRAMUSCULAR | Status: DC | PRN

## 2023-10-08 MED ORDER — LIDOCAINE-EPINEPHRINE 2 %-1:100000 IJ SOLN
INTRAMUSCULAR | Status: AC
  Filled 2023-10-08: qty 1

## 2023-10-08 MED ORDER — ACETAMINOPHEN 500 MG PO TABS
1000.0000 mg | ORAL_TABLET | Freq: Once | ORAL | Status: AC
  Administered 2023-10-08: 1000 mg via ORAL

## 2023-10-08 MED ORDER — OXYCODONE HCL 5 MG PO TABS: 5.0000 mg | ORAL_TABLET | Freq: Once | ORAL | Status: DC | PRN

## 2023-10-08 MED ORDER — FENTANYL CITRATE (PF) 100 MCG/2ML IJ SOLN
INTRAMUSCULAR | Status: DC | PRN
  Administered 2023-10-08: 100 ug via INTRAVENOUS

## 2023-10-08 MED ORDER — STERILE WATER FOR IRRIGATION IR SOLN
Status: DC | PRN
  Administered 2023-10-08: 1000 mL

## 2023-10-08 MED ORDER — ACETAMINOPHEN 500 MG PO TABS
ORAL_TABLET | ORAL | Status: AC
  Filled 2023-10-08: qty 2

## 2023-10-08 MED ORDER — BACITRACIN-NEOMYCIN-POLYMYXIN OINTMENT TUBE
TOPICAL_OINTMENT | CUTANEOUS | Status: AC
  Filled 2023-10-08: qty 14.17

## 2023-10-08 MED ORDER — DEXAMETHASONE SODIUM PHOSPHATE 10 MG/ML IJ SOLN
INTRAMUSCULAR | Status: DC | PRN
Start: 2023-10-08 — End: 2023-10-08
  Administered 2023-10-08: 10 mg via INTRAVENOUS

## 2023-10-08 MED ORDER — DEXMEDETOMIDINE HCL IN NACL 80 MCG/20ML IV SOLN
INTRAVENOUS | Status: DC | PRN
  Administered 2023-10-08 (×2): 4 ug via INTRAVENOUS

## 2023-10-08 MED ORDER — PROPOFOL 10 MG/ML IV BOLUS
INTRAVENOUS | Status: DC | PRN
  Administered 2023-10-08: 150 mg via INTRAVENOUS

## 2023-10-08 MED ORDER — ONDANSETRON HCL 4 MG/2ML IJ SOLN
INTRAMUSCULAR | Status: DC | PRN
  Administered 2023-10-08: 4 mg via INTRAVENOUS

## 2023-10-08 MED ORDER — LIDOCAINE-EPINEPHRINE 2 %-1:100000 IJ SOLN
INTRAMUSCULAR | Status: DC | PRN
  Administered 2023-10-08: 18 mL via INTRADERMAL

## 2023-10-08 MED ORDER — SCOPOLAMINE 1 MG/3DAYS TD PT72
MEDICATED_PATCH | TRANSDERMAL | Status: AC
  Filled 2023-10-08: qty 1

## 2023-10-08 MED ORDER — FENTANYL CITRATE (PF) 100 MCG/2ML IJ SOLN
25.0000 ug | INTRAMUSCULAR | Status: DC | PRN
  Administered 2023-10-08: 50 ug via INTRAVENOUS
  Administered 2023-10-08: 25 ug via INTRAVENOUS

## 2023-10-08 MED ORDER — LIDOCAINE 2% (20 MG/ML) 5 ML SYRINGE
INTRAMUSCULAR | Status: DC | PRN
  Administered 2023-10-08: 100 mg via INTRAVENOUS

## 2023-10-08 MED ORDER — AMOXICILLIN 500 MG PO CAPS: 500.0000 mg | ORAL_CAPSULE | Freq: Three times a day (TID) | ORAL | 0 refills | Status: AC

## 2023-10-08 MED ORDER — CEFAZOLIN SODIUM-DEXTROSE 2-4 GM/100ML-% IV SOLN
INTRAVENOUS | Status: AC
  Filled 2023-10-08: qty 100

## 2023-10-08 MED ORDER — SCOPOLAMINE 1 MG/3DAYS TD PT72
1.0000 | MEDICATED_PATCH | Freq: Once | TRANSDERMAL | Status: DC
  Administered 2023-10-08: 1.5 mg via TRANSDERMAL

## 2023-10-08 MED ORDER — MIDAZOLAM HCL 2 MG/2ML IJ SOLN
INTRAMUSCULAR | Status: AC
  Filled 2023-10-08: qty 2

## 2023-10-08 MED ORDER — OXYCODONE HCL 5 MG/5ML PO SOLN: 5.0000 mg | Freq: Once | ORAL | Status: DC | PRN

## 2023-10-08 MED ORDER — MIDAZOLAM HCL 2 MG/2ML IJ SOLN
INTRAMUSCULAR | Status: DC | PRN
  Administered 2023-10-08: 2 mg via INTRAVENOUS

## 2023-10-08 MED ORDER — SUCCINYLCHOLINE CHLORIDE 200 MG/10ML IV SOSY
PREFILLED_SYRINGE | INTRAVENOUS | Status: DC | PRN
  Administered 2023-10-08: 100 mg via INTRAVENOUS

## 2023-10-08 SURGICAL SUPPLY — 34 items
BLADE SURG 15 STRL LF DISP TIS (BLADE) ×1 IMPLANT
BNDG COHESIVE 2X5 TAN ST LF (GAUZE/BANDAGES/DRESSINGS) IMPLANT
BNDG EYE OVAL 2 1/8 X 2 5/8 (GAUZE/BANDAGES/DRESSINGS) IMPLANT
BUR CROSS CUT FISSURE 1.6 (BURR) ×1 IMPLANT
BUR EGG ELITE 4.0 (BURR) IMPLANT
CANISTER SUCT 1200ML W/VALVE (MISCELLANEOUS) ×1 IMPLANT
CATH ROBINSON RED A/P 10FR (CATHETERS) IMPLANT
COVER BACK TABLE 60X90IN (DRAPES) ×1 IMPLANT
COVER MAYO STAND STRL (DRAPES) ×1 IMPLANT
DRAPE U-SHAPE 76X120 STRL (DRAPES) ×1 IMPLANT
GLOVE BIO SURGEON STRL SZ 6.5 (GLOVE) IMPLANT
GLOVE BIO SURGEON STRL SZ8 (GLOVE) ×1 IMPLANT
GLOVE BIOGEL PI IND STRL 6.5 (GLOVE) IMPLANT
GOWN STRL REUS W/ TWL LRG LVL3 (GOWN DISPOSABLE) ×1 IMPLANT
GOWN STRL REUS W/ TWL XL LVL3 (GOWN DISPOSABLE) ×1 IMPLANT
IV NS 500ML BAXH (IV SOLUTION) ×1 IMPLANT
NDL HYPO 22X1.5 SAFETY MO (MISCELLANEOUS) ×1 IMPLANT
NEEDLE HYPO 22X1.5 SAFETY MO (MISCELLANEOUS) ×1 IMPLANT
PACK BASIN DAY SURGERY FS (CUSTOM PROCEDURE TRAY) ×1 IMPLANT
SLEEVE IRRIGATION ELITE 7 (MISCELLANEOUS) ×1 IMPLANT
SLEEVE SCD COMPRESS KNEE MED (STOCKING) IMPLANT
SPIKE FLUID TRANSFER (MISCELLANEOUS) IMPLANT
SPONGE SURGIFOAM ABS GEL 12-7 (HEMOSTASIS) IMPLANT
SPONGE T-LAP 4X18 ~~LOC~~+RFID (SPONGE) ×1 IMPLANT
SUT CHROMIC 3 0 SH 27 (SUTURE) IMPLANT
SYR BULB EAR ULCER 3OZ GRN STR (SYRINGE) ×1 IMPLANT
SYR CONTROL 10ML LL (SYRINGE) ×1 IMPLANT
TOOTHBRUSH ADULT (PERSONAL CARE ITEMS) IMPLANT
TOWEL GREEN STERILE FF (TOWEL DISPOSABLE) ×1 IMPLANT
TRAY DSU PREP LF (CUSTOM PROCEDURE TRAY) IMPLANT
TUBE CONNECTING 20X1/4 (TUBING) ×1 IMPLANT
TUBING IRRIGATION (MISCELLANEOUS) ×1 IMPLANT
WATER STERILE IRR 1000ML POUR (IV SOLUTION) ×1 IMPLANT
YANKAUER SUCT BULB TIP NO VENT (SUCTIONS) ×1 IMPLANT

## 2023-10-08 NOTE — Op Note (Unsigned)
 NAME: Pamela Sanford, BEADLE MEDICAL RECORD NO: [id number] ACCOUNT NO: [id number] DATE OF BIRTH: 31-Aug-1980 FACILITY: [facility] LOCATION: [facility] PHYSICIAN: Cornelia Dieter, DDS  Operative Report   DATE OF PROCEDURE: 10/08/2023  PREOPERATIVE DIAGNOSES:  Nonrestorable teeth numbers 3, 14, 20, 21, 22, 23, 24, 25, 26, 27, 28, and 29 secondary to dental caries.  POSTOPERATIVE DIAGNOSES:  Nonrestorable teeth numbers 3, 14, 20, 21, 22, 23, 24, 25, 26, 27, 28, and 29 secondary to dental caries.  PROCEDURE:  Extraction of teeth numbers 3, 14, 20, 21, 22, 23, 24, 25, 26, 27, 28, and 29.  SURGEON:  Cornelia Dieter, DDS  ANESTHESIA:  General. Nasal intubation.  Dr. Jarrell Merritts, attending.  DESCRIPTION OF PROCEDURE:  The patient was taken to the operating room and placed on the table in supine position.  General anesthesia was administered.  A nasal endotracheal tube was placed and secured.  The eyes were protected and the patient was  draped for surgery.  Timeout was performed.  The posterior pharynx was suctioned.  A throat pack was placed.  2% lidocaine  with 1:100,000 epinephrine was infiltrated in an inferior alveolar block on the right and left sides and buccal and palatal  infiltration of the maxilla around the teeth to be removed.  Additional anesthesia was given in the anterior mandible, vestibule.  A bite block was placed on the right side of the mouth.  A 15 blade was used to make an incision beginning at tooth #20,  carried forward to tooth numbers 21, 22, 23, 24, 25, and 26 in the buccal and lingual sulcus.  Another incision was made around the roots of tooth #14.  The periosteum was reflected from around these teeth.  Tooth #14 was elevated with a 301 elevator and  removed from the mouth.  Then, teeth numbers 20, 21, 22, 23, 24, 25, and 26 were elevated with a 301 elevator and removed with the Ashe forceps.  The sockets were then curetted.  The tissue was trimmed and then the area was irrigated and  closed with 3-0  chromic.  Then, attention was turned to the right side of the mouth.  A 15 blade was used to make an incision around teeth numbers 26, 27, 28, and 29 in the mandible and around tooth #3 in the maxilla.  The periosteum was reflected with a periosteal  elevator.  The teeth were elevated and removed from the mouth with the dental forceps.  The sockets were curetted.  The lower sockets were sutured with 3-0 chromic after irrigating with saline and then the oral cavity was then irrigated and suctioned.   The throat pack was removed.  The patient was left in the care of anesthesia for extubation and transport to recovery with plans for discharge home through day surgery.  ESTIMATED BLOOD LOSS:  Minimum.  COMPLICATIONS:  None.  SPECIMENS:  None.  COUNTS:  Correct.   PUS D: 10/08/2023 12:51:02 pm T: 10/08/2023 4:34:00 pm  JOB: [id number]/ [id number]

## 2023-10-08 NOTE — Anesthesia Procedure Notes (Addendum)
 Procedure Name: Intubation Date/Time: 10/08/2023 12:17 PM  Performed by: Steffani Edman, CRNAPre-anesthesia Checklist: Patient identified, Emergency Drugs available, Suction available and Patient being monitored Patient Re-evaluated:Patient Re-evaluated prior to induction Oxygen Delivery Method: Circle System Utilized Preoxygenation: Pre-oxygenation with 100% oxygen Induction Type: IV induction Ventilation: Mask ventilation without difficulty Laryngoscope Size: Mac and 3 Grade View: Grade I Tube type: Oral Nasal Tubes: Left, Nasal prep performed and Nasal Rae Tube size: 7.0 mm Number of attempts: 1 Airway Equipment and Method: Stylet and Oral airway Placement Confirmation: ETT inserted through vocal cords under direct vision, positive ETCO2 and breath sounds checked- equal and bilateral Tube secured with: Tape Dental Injury: Teeth and Oropharynx as per pre-operative assessment

## 2023-10-08 NOTE — Op Note (Signed)
 10/08/2023  12:47 PM  PATIENT:  Pamela Sanford  43 y.o. female  PRE-OPERATIVE DIAGNOSIS:  Non restorable teeth # 3 , 14, 20, 21, 22, 23, 24, 25, 26, 27, 28, 29  POST-OPERATIVE DIAGNOSIS:  SAME  PROCEDURE:  Procedure(s): EXTRACTIONS teeth # 3 , 14, 20, 21, 22, 23, 24, 25, 26, 27, 28, 29  SURGEON:  Surgeon(s): Ascencion Lava, DMD  ANESTHESIA:   local and general  EBL:  minimal  DRAINS: none   SPECIMEN:  No Specimen  COUNTS:  YES  PLAN OF CARE: Discharge to home after PACU  PATIENT DISPOSITION:  PACU - hemodynamically stable.   PROCEDURE DETAILS: Dictation # [id number]  Cornelia Dieter, DMD 10/08/2023 12:47 PM

## 2023-10-08 NOTE — OR Nursing (Signed)
 Throat pack in 1224, out 1243

## 2023-10-08 NOTE — Discharge Instructions (Signed)
  Post Anesthesia Home Care Instructions  Activity: Get plenty of rest for the remainder of the day. A responsible individual must stay with you for 24 hours following the procedure.  For the next 24 hours, DO NOT: -Drive a car -Advertising copywriter -Drink alcoholic beverages -Take any medication unless instructed by your physician -Make any legal decisions or sign important papers.  Meals: Start with liquid foods such as gelatin or soup. Progress to regular foods as tolerated. Avoid greasy, spicy, heavy foods. If nausea and/or vomiting occur, drink only clear liquids until the nausea and/or vomiting subsides. Call your physician if vomiting continues.  Special Instructions/Symptoms: Your throat may feel dry or sore from the anesthesia or the breathing tube placed in your throat during surgery. If this causes discomfort, gargle with warm salt water . The discomfort should disappear within 24 hours.  If you had a scopolamine patch placed behind your ear for the management of post- operative nausea and/or vomiting:  1. The medication in the patch is effective for 72 hours, after which it should be removed.  Wrap patch in a tissue and discard in the trash. Wash hands thoroughly with soap and water . 2. You may remove the patch earlier than 72 hours if you experience unpleasant side effects which may include dry mouth, dizziness or visual disturbances. 3. Avoid touching the patch. Wash your hands with soap and water  after contact with the patch.   Post Anesthesia Home Care Instructions  Activity: Get plenty of rest for the remainder of the day. A responsible individual must stay with you for 24 hours following the procedure.  For the next 24 hours, DO NOT: -Drive a car -Advertising copywriter -Drink alcoholic beverages -Take any medication unless instructed by your physician -Make any legal decisions or sign important papers.  Meals: Start with liquid foods such as gelatin or soup. Progress to  regular foods as tolerated. Avoid greasy, spicy, heavy foods. If nausea and/or vomiting occur, drink only clear liquids until the nausea and/or vomiting subsides. Call your physician if vomiting continues.  Special Instructions/Symptoms: Your throat may feel dry or sore from the anesthesia or the breathing tube placed in your throat during surgery. If this causes discomfort, gargle with warm salt water . The discomfort should disappear within 24 hours.  If you had a scopolamine patch placed behind your ear for the management of post- operative nausea and/or vomiting:  1. The medication in the patch is effective for 72 hours, after which it should be removed.  Wrap patch in a tissue and discard in the trash. Wash hands thoroughly with soap and water . 2. You may remove the patch earlier than 72 hours if you experience unpleasant side effects which may include dry mouth, dizziness or visual disturbances. 3. Avoid touching the patch. Wash your hands with soap and water  after contact with the patch.     No tylenol  until after 4:44 if needed.

## 2023-10-08 NOTE — Transfer of Care (Signed)
 Immediate Anesthesia Transfer of Care Note  Patient: Sergio L Rattigan  Procedure(s) Performed: DENTAL RESTORATION/EXTRACTIONS (Mouth)  Patient Location: PACU  Anesthesia Type:General  Level of Consciousness: awake, alert , and oriented  Airway & Oxygen Therapy: Patient Spontanous Breathing and Patient connected to face mask oxygen  Post-op Assessment: Report given to RN and Post -op Vital signs reviewed and stable  Post vital signs: Reviewed and stable  Last Vitals:  Vitals Value Taken Time  BP 102/83   Temp    Pulse 112 10/08/23 1301  Resp 19 10/08/23 1301  SpO2 99 % 10/08/23 1301  Vitals shown include unfiled device data.  Last Pain:  Vitals:   10/08/23 1109  TempSrc: Temporal  PainSc: 0-No pain      Patients Stated Pain Goal: 4 (10/08/23 1109)  Complications: No notable events documented.

## 2023-10-08 NOTE — H&P (Signed)
 H&P documentation  -History and Physical Reviewed  -Patient has been re-examined  -No change in the plan of care  Pamela Sanford

## 2023-10-08 NOTE — Anesthesia Postprocedure Evaluation (Signed)
 Anesthesia Post Note  Patient: Pamela Sanford  Procedure(s) Performed: DENTAL RESTORATION/EXTRACTIONS (Mouth)     Patient location during evaluation: PACU Anesthesia Type: General Level of consciousness: awake and alert Pain management: pain level controlled Vital Signs Assessment: post-procedure vital signs reviewed and stable Respiratory status: spontaneous breathing, nonlabored ventilation and respiratory function stable Cardiovascular status: blood pressure returned to baseline Postop Assessment: no apparent nausea or vomiting Anesthetic complications: no   No notable events documented.  Last Vitals:  Vitals:   10/08/23 1330 10/08/23 1345  BP: 122/74 121/77  Pulse: 84 87  Resp: 12 14  Temp:  36.8 C  SpO2: 97% 96%    Last Pain:  Vitals:   10/08/23 1345  TempSrc:   PainSc: 2                  Rayfield Cairo

## 2023-10-09 ENCOUNTER — Encounter (HOSPITAL_BASED_OUTPATIENT_CLINIC_OR_DEPARTMENT_OTHER): Payer: Self-pay | Admitting: Oral Surgery

## 2023-11-09 DIAGNOSIS — Z79899 Other long term (current) drug therapy: Secondary | ICD-10-CM | POA: Diagnosis not present

## 2023-11-09 DIAGNOSIS — Z885 Allergy status to narcotic agent status: Secondary | ICD-10-CM | POA: Diagnosis not present

## 2023-11-09 DIAGNOSIS — L02416 Cutaneous abscess of left lower limb: Secondary | ICD-10-CM | POA: Diagnosis not present

## 2023-11-09 DIAGNOSIS — Z886 Allergy status to analgesic agent status: Secondary | ICD-10-CM | POA: Diagnosis not present

## 2023-11-09 DIAGNOSIS — L03116 Cellulitis of left lower limb: Secondary | ICD-10-CM | POA: Diagnosis not present

## 2023-11-09 DIAGNOSIS — I10 Essential (primary) hypertension: Secondary | ICD-10-CM | POA: Diagnosis not present

## 2023-11-09 DIAGNOSIS — L02612 Cutaneous abscess of left foot: Secondary | ICD-10-CM | POA: Diagnosis not present
